# Patient Record
Sex: Female | Born: 1955 | Race: White | Hispanic: No | Marital: Married | Smoking: Former smoker
Health system: Southern US, Community
[De-identification: ages and names within clinical notes are randomized; demographics above are authoritative.]

## PROBLEM LIST (undated history)

## (undated) DIAGNOSIS — E669 Obesity, unspecified: Secondary | ICD-10-CM

## (undated) DIAGNOSIS — N200 Calculus of kidney: Secondary | ICD-10-CM

## (undated) DIAGNOSIS — R7303 Prediabetes: Secondary | ICD-10-CM

## (undated) DIAGNOSIS — F419 Anxiety disorder, unspecified: Secondary | ICD-10-CM

## (undated) DIAGNOSIS — Z9989 Dependence on other enabling machines and devices: Secondary | ICD-10-CM

## (undated) DIAGNOSIS — K219 Gastro-esophageal reflux disease without esophagitis: Secondary | ICD-10-CM

## (undated) DIAGNOSIS — G4730 Sleep apnea, unspecified: Secondary | ICD-10-CM

## (undated) DIAGNOSIS — S83206A Unspecified tear of unspecified meniscus, current injury, right knee, initial encounter: Secondary | ICD-10-CM

## (undated) DIAGNOSIS — I8392 Asymptomatic varicose veins of left lower extremity: Secondary | ICD-10-CM

## (undated) DIAGNOSIS — M1990 Unspecified osteoarthritis, unspecified site: Secondary | ICD-10-CM

## (undated) DIAGNOSIS — Z87442 Personal history of urinary calculi: Secondary | ICD-10-CM

## (undated) HISTORY — DX: Prediabetes: R73.03

## (undated) HISTORY — PX: COLONOSCOPY: SHX174

## (undated) HISTORY — DX: Dependence on other enabling machines and devices: Z99.89

## (undated) HISTORY — DX: Calculus of kidney: N20.0

## (undated) HISTORY — DX: Unspecified tear of unspecified meniscus, current injury, right knee, initial encounter: S83.206A

## (undated) HISTORY — DX: Anxiety disorder, unspecified: F41.9

## (undated) HISTORY — PX: WISDOM TOOTH EXTRACTION: SHX21

## (undated) HISTORY — PX: TONSILLECTOMY AND ADENOIDECTOMY: SUR1326

## (undated) HISTORY — DX: Gastro-esophageal reflux disease without esophagitis: K21.9

---

## 1994-07-07 HISTORY — PX: CERVICAL BIOPSY  W/ LOOP ELECTRODE EXCISION: SUR135

## 1996-07-07 HISTORY — PX: COLPOSCOPY: SHX161

## 2000-01-27 ENCOUNTER — Other Ambulatory Visit: Admission: RE | Admit: 2000-01-27 | Discharge: 2000-01-27 | Payer: Self-pay | Admitting: *Deleted

## 2000-09-02 ENCOUNTER — Other Ambulatory Visit: Admission: RE | Admit: 2000-09-02 | Discharge: 2000-09-02 | Payer: Self-pay | Admitting: Obstetrics and Gynecology

## 2001-02-15 ENCOUNTER — Other Ambulatory Visit: Admission: RE | Admit: 2001-02-15 | Discharge: 2001-02-15 | Payer: Self-pay | Admitting: Obstetrics and Gynecology

## 2001-07-07 DIAGNOSIS — S83206A Unspecified tear of unspecified meniscus, current injury, right knee, initial encounter: Secondary | ICD-10-CM

## 2001-07-07 HISTORY — DX: Unspecified tear of unspecified meniscus, current injury, right knee, initial encounter: S83.206A

## 2001-11-05 ENCOUNTER — Other Ambulatory Visit: Admission: RE | Admit: 2001-11-05 | Discharge: 2001-11-05 | Payer: Self-pay | Admitting: Obstetrics and Gynecology

## 2002-07-07 DIAGNOSIS — C801 Malignant (primary) neoplasm, unspecified: Secondary | ICD-10-CM

## 2002-07-07 HISTORY — DX: Malignant (primary) neoplasm, unspecified: C80.1

## 2003-01-20 ENCOUNTER — Other Ambulatory Visit: Admission: RE | Admit: 2003-01-20 | Discharge: 2003-01-20 | Payer: Self-pay | Admitting: Obstetrics and Gynecology

## 2004-04-16 ENCOUNTER — Other Ambulatory Visit: Admission: RE | Admit: 2004-04-16 | Discharge: 2004-04-16 | Payer: Self-pay | Admitting: Obstetrics and Gynecology

## 2004-05-13 ENCOUNTER — Ambulatory Visit (HOSPITAL_COMMUNITY): Admission: RE | Admit: 2004-05-13 | Discharge: 2004-05-13 | Payer: Self-pay | Admitting: Obstetrics and Gynecology

## 2004-07-07 HISTORY — PX: KNEE SURGERY: SHX244

## 2004-07-20 ENCOUNTER — Emergency Department: Payer: Self-pay | Admitting: Emergency Medicine

## 2005-04-23 ENCOUNTER — Other Ambulatory Visit: Admission: RE | Admit: 2005-04-23 | Discharge: 2005-04-23 | Payer: Self-pay | Admitting: Obstetrics and Gynecology

## 2005-06-02 ENCOUNTER — Ambulatory Visit (HOSPITAL_BASED_OUTPATIENT_CLINIC_OR_DEPARTMENT_OTHER): Admission: RE | Admit: 2005-06-02 | Discharge: 2005-06-02 | Payer: Self-pay | Admitting: Obstetrics and Gynecology

## 2005-06-02 ENCOUNTER — Ambulatory Visit (HOSPITAL_COMMUNITY): Admission: RE | Admit: 2005-06-02 | Discharge: 2005-06-02 | Payer: Self-pay | Admitting: Obstetrics and Gynecology

## 2005-06-02 HISTORY — PX: OTHER SURGICAL HISTORY: SHX169

## 2005-07-03 ENCOUNTER — Ambulatory Visit (HOSPITAL_COMMUNITY): Admission: RE | Admit: 2005-07-03 | Discharge: 2005-07-03 | Payer: Self-pay | Admitting: Obstetrics and Gynecology

## 2005-12-05 DIAGNOSIS — K219 Gastro-esophageal reflux disease without esophagitis: Secondary | ICD-10-CM

## 2005-12-05 HISTORY — DX: Gastro-esophageal reflux disease without esophagitis: K21.9

## 2006-06-24 ENCOUNTER — Ambulatory Visit (HOSPITAL_COMMUNITY): Admission: RE | Admit: 2006-06-24 | Discharge: 2006-06-24 | Payer: Self-pay | Admitting: Obstetrics and Gynecology

## 2006-08-25 ENCOUNTER — Other Ambulatory Visit: Admission: RE | Admit: 2006-08-25 | Discharge: 2006-08-25 | Payer: Self-pay | Admitting: Obstetrics and Gynecology

## 2007-07-02 ENCOUNTER — Ambulatory Visit (HOSPITAL_COMMUNITY): Admission: RE | Admit: 2007-07-02 | Discharge: 2007-07-02 | Payer: Self-pay | Admitting: Obstetrics and Gynecology

## 2007-08-27 ENCOUNTER — Other Ambulatory Visit: Admission: RE | Admit: 2007-08-27 | Discharge: 2007-08-27 | Payer: Self-pay | Admitting: Obstetrics and Gynecology

## 2008-07-04 ENCOUNTER — Ambulatory Visit (HOSPITAL_COMMUNITY): Admission: RE | Admit: 2008-07-04 | Discharge: 2008-07-04 | Payer: Self-pay | Admitting: Obstetrics and Gynecology

## 2008-09-01 ENCOUNTER — Other Ambulatory Visit: Admission: RE | Admit: 2008-09-01 | Discharge: 2008-09-01 | Payer: Self-pay | Admitting: Obstetrics and Gynecology

## 2009-04-04 ENCOUNTER — Emergency Department: Payer: Self-pay | Admitting: Internal Medicine

## 2009-07-11 ENCOUNTER — Ambulatory Visit (HOSPITAL_COMMUNITY): Admission: RE | Admit: 2009-07-11 | Discharge: 2009-07-11 | Payer: Self-pay | Admitting: Obstetrics and Gynecology

## 2010-07-31 ENCOUNTER — Ambulatory Visit (HOSPITAL_COMMUNITY)
Admission: RE | Admit: 2010-07-31 | Discharge: 2010-07-31 | Payer: Self-pay | Source: Home / Self Care | Attending: Obstetrics and Gynecology | Admitting: Obstetrics and Gynecology

## 2010-08-29 ENCOUNTER — Ambulatory Visit: Payer: Self-pay | Admitting: Family Medicine

## 2010-11-22 NOTE — Op Note (Signed)
NAME:  Evelyn Hayes, Evelyn Hayes            ACCOUNT NO.:  [id number]   MEDICAL RECORD NO.:  [id number]          PATIENT TYPE:  AMB   LOCATION:  [facility]                         FACILITY:  [facility]   PHYSICIAN:  Lorianna P. Romine, M.D.DATE OF BIRTH:  24-Apr-1956   DATE OF PROCEDURE:  06/02/2005  DATE OF DISCHARGE:                                 OPERATIVE REPORT   PREOPERATIVE DIAGNOSIS:  Menorrhagia.   POSTOPERATIVE DIAGNOSIS:  Menorrhagia.   PROCEDURE:  Endometrial ablation using the Hydrotherm ablator   SURGEON:  Maleny P. Romine, M.D.   ANESTHESIA:  General by LMA.   ESTIMATED BLOOD LOSS:  Minimal.   COMPLICATIONS:  None.   DESCRIPTION OF PROCEDURE:  The patient was taken to the operating room and  after induction of adequate general anesthesia was placed in the dorsal  lithotomy position and prepped and draped in the usual fashion. The bladder  was drained with a rubber catheter. A posterior weighted and anterior Sims  retractor were placed and the cervix was grasped on the anterior lip with a  single-tooth tenaculum. The uterus was sounded to 8 cm. The cervix was  dilated to a number 23 Pratt. The hysteroscope was introduced. Proper  documentation of the placement of a hysteroscope inside the endometrial  cavity was done by noting the presence of the tubal ostia. Photographic  documentation was taken. The scope was removed to just inside the internal  os. Endometrial ablation was then carried out in the usual fashion according  to the manufacturer's instructions. Following ablation, photographic  documentation again was taken, the scope was withdrawn, the instruments were  removed and the procedure was terminated. The patient tolerated it well and  went in satisfactory condition to post anesthesia recovery.           ______________________________  Edwena Felty. Romine, M.D.     CPR/MEDQ  D:  06/02/2005  T:  06/02/2005  Job:  [id number]

## 2011-03-17 ENCOUNTER — Ambulatory Visit: Payer: Self-pay | Admitting: Family Medicine

## 2011-04-01 ENCOUNTER — Ambulatory Visit: Payer: Self-pay | Admitting: Family Medicine

## 2011-05-08 DIAGNOSIS — Z9989 Dependence on other enabling machines and devices: Secondary | ICD-10-CM

## 2011-05-08 HISTORY — DX: Dependence on other enabling machines and devices: Z99.89

## 2011-07-11 ENCOUNTER — Other Ambulatory Visit: Payer: Self-pay | Admitting: Obstetrics and Gynecology

## 2011-07-11 DIAGNOSIS — Z1231 Encounter for screening mammogram for malignant neoplasm of breast: Secondary | ICD-10-CM

## 2011-08-07 ENCOUNTER — Ambulatory Visit (HOSPITAL_COMMUNITY)
Admission: RE | Admit: 2011-08-07 | Discharge: 2011-08-07 | Disposition: A | Discharge status: Home / Self Care | Payer: BC Managed Care – PPO | Source: Ambulatory Visit | Attending: Obstetrics and Gynecology | Admitting: Obstetrics and Gynecology

## 2011-08-07 DIAGNOSIS — Z1231 Encounter for screening mammogram for malignant neoplasm of breast: Secondary | ICD-10-CM | POA: Insufficient documentation

## 2012-07-05 ENCOUNTER — Other Ambulatory Visit: Payer: Self-pay | Admitting: Obstetrics and Gynecology

## 2012-07-05 DIAGNOSIS — Z1231 Encounter for screening mammogram for malignant neoplasm of breast: Secondary | ICD-10-CM

## 2012-08-03 ENCOUNTER — Ambulatory Visit: Payer: Self-pay | Admitting: Orthopedic Surgery

## 2012-08-17 ENCOUNTER — Ambulatory Visit (HOSPITAL_COMMUNITY): Payer: BC Managed Care – PPO

## 2012-08-20 ENCOUNTER — Ambulatory Visit (HOSPITAL_COMMUNITY): Payer: BC Managed Care – PPO

## 2012-08-31 ENCOUNTER — Ambulatory Visit (HOSPITAL_COMMUNITY)
Admission: RE | Admit: 2012-08-31 | Discharge: 2012-08-31 | Disposition: A | Discharge status: Home / Self Care | Payer: BC Managed Care – PPO | Source: Ambulatory Visit | Attending: Obstetrics and Gynecology | Admitting: Obstetrics and Gynecology

## 2012-08-31 DIAGNOSIS — Z1231 Encounter for screening mammogram for malignant neoplasm of breast: Secondary | ICD-10-CM | POA: Insufficient documentation

## 2013-01-17 ENCOUNTER — Ambulatory Visit: Payer: Self-pay | Admitting: Obstetrics and Gynecology

## 2013-01-27 ENCOUNTER — Encounter: Payer: Self-pay | Admitting: Obstetrics and Gynecology

## 2013-01-28 ENCOUNTER — Encounter: Payer: Self-pay | Admitting: Obstetrics and Gynecology

## 2013-01-28 ENCOUNTER — Ambulatory Visit (INDEPENDENT_AMBULATORY_CARE_PROVIDER_SITE_OTHER): Payer: BC Managed Care – PPO | Admitting: Obstetrics and Gynecology

## 2013-01-28 VITALS — BP 122/68 | HR 76 | Resp 18 | Ht 62.75 in | Wt 214.0 lb

## 2013-01-28 DIAGNOSIS — Z0000 Encounter for general adult medical examination without abnormal findings: Secondary | ICD-10-CM

## 2013-01-28 DIAGNOSIS — Z01419 Encounter for gynecological examination (general) (routine) without abnormal findings: Secondary | ICD-10-CM

## 2013-01-28 NOTE — Patient Instructions (Addendum)

## 2013-01-28 NOTE — Progress Notes (Signed)
57 y.o.   Married    Caucasian   female   G3P3   here for annual exam.    Patient's last menstrual period was 04/06/2008.          Sexually active: yes  The current method of family planning is vasectomy.    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 Osteoporosis Maternal Grandmother   . Hypertension Mother   . Heart disease Father     There are no active problems to display for this patient.   Past Medical History  Diagnosis Date  . 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 Knee cartilage, torn, right 2003  . GERD (gastroesophageal reflux disease)   . 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 Hypo thermal ablation  06/02/05  . 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 Tonsillectomy and adenoidectomy    . Knee surgery  07/2004  . Colposcopy  1998  . Wisdom tooth extraction      Allergies: Review of patient's allergies indicates no known allergies.  Current Outpatient Prescriptions  Medication Sig Dispense Refill  . aspirin 81 MG tablet Take 81 mg by mouth daily.      . Calcium Carbonate (CALCIUM 600 PO) Take by mouth 2 (two) times daily.      . Fish Oil-Cholecalciferol (FISH OIL + D3 PO) Take by mouth daily.      . fluticasone (FLONASE) 50 MCG/ACT nasal spray       . Glucosamine HCl (GLUCOSAMINE PO) Take by mouth daily.      . Naproxen Sodium (ALEVE PO) Take by mouth as needed.      Marland Kitchen OMEPRAZOLE PO Take 40 mg by mouth daily.        No current facility-administered  medications for this visit.    ROS: Pertinent items are noted in HPI.  Social Hx:  Married, three children, works as Interior and spatial designer of [employer]. Travels two weeks out of every month.  Exam:    BP 122/68  Pulse 76  Resp 18  Ht 5' 2.75" (1.594 m)  Wt 214 lb (97.07 kg)  BMI 38.2 kg/m2  LMP 04/06/2008  Ht stable and wt down 14 pounds from last year Wt Readings from Last 3 Encounters:  01/28/13 214 lb (97.07 kg)     Ht Readings from Last 3 Encounters:  01/28/13 5' 2.75" (1.594 m)    General appearance: alert, cooperative and appears stated age Head: Normocephalic, without obvious abnormality, atraumatic Neck: no adenopathy, supple, symmetrical, trachea midline and thyroid not enlarged, symmetric, no tenderness/mass/nodules Lungs: clear to auscultation bilaterally Breasts: Inspection negative, No nipple retraction or dimpling, No nipple discharge or bleeding, No axillary or supraclavicular adenopathy, Normal to palpation without dominant masses Heart: regular rate and rhythm Abdomen: soft, non-tender; bowel sounds normal; no masses,  no organomegaly Extremities: extremities normal, atraumatic, no cyanosis or edema Skin: Skin color, texture, turgor normal. No rashes or lesions Lymph  nodes: Cervical, supraclavicular, and axillary nodes normal. No abnormal inguinal nodes palpated Neurologic: Grossly normal   Pelvic: External genitalia:  no lesions              Urethra:  normal appearing urethra with no masses, tenderness or lesions              Bartholins and Skenes: normal                 Vagina: normal appearing vagina with normal color and discharge, no lesions, cx just behind the introitus and there is a Grade 2-3 cystocele              Cervix: normal appearance              Pap taken: yes        Bimanual Exam:  Uterus:  uterus is normal size, shape, consistency and nontender                                      Adnexa: normal adnexa in size, nontender and no masses                                       Rectovaginal: Confirms                                      Anus:  normal sphincter tone, no lesions  A: normal menopausal exam, no HRT     HTA 2006 with subsequent amenorrhea     LEEP showing CIN 3 in 1996     GERD     Gr 2-3 cystocele with uterine prolapse     P:     mammogram pap smear counseled on breast self exam, mammography screening, adequate intake of calcium and vitamin D, diet and exercise return annually or prn   Discussed option for surgery for prolapse vs pessary.  Pt wants to try the pessary.     An After Visit Summary was printed and given to the patient.

## 2013-02-01 LAB — IPS PAP TEST WITH HPV

## 2013-08-17 ENCOUNTER — Other Ambulatory Visit: Payer: Self-pay | Admitting: Nurse Practitioner

## 2013-08-17 DIAGNOSIS — Z1231 Encounter for screening mammogram for malignant neoplasm of breast: Secondary | ICD-10-CM

## 2013-09-06 ENCOUNTER — Ambulatory Visit (HOSPITAL_COMMUNITY)
Admission: RE | Admit: 2013-09-06 | Discharge: 2013-09-06 | Disposition: A | Discharge status: Home / Self Care | Payer: BC Managed Care – PPO | Source: Ambulatory Visit | Attending: Nurse Practitioner | Admitting: Nurse Practitioner

## 2013-09-06 DIAGNOSIS — Z1231 Encounter for screening mammogram for malignant neoplasm of breast: Secondary | ICD-10-CM | POA: Insufficient documentation

## 2014-05-08 ENCOUNTER — Encounter: Payer: Self-pay | Admitting: Obstetrics and Gynecology

## 2014-08-15 ENCOUNTER — Other Ambulatory Visit (HOSPITAL_COMMUNITY): Payer: Self-pay | Admitting: Family Medicine

## 2014-08-15 DIAGNOSIS — Z1231 Encounter for screening mammogram for malignant neoplasm of breast: Secondary | ICD-10-CM

## 2014-09-12 ENCOUNTER — Ambulatory Visit (HOSPITAL_COMMUNITY)
Admission: RE | Admit: 2014-09-12 | Discharge: 2014-09-12 | Disposition: A | Discharge status: Home / Self Care | Payer: BLUE CROSS/BLUE SHIELD | Source: Ambulatory Visit | Attending: Family Medicine | Admitting: Family Medicine

## 2014-09-12 DIAGNOSIS — Z1231 Encounter for screening mammogram for malignant neoplasm of breast: Secondary | ICD-10-CM | POA: Diagnosis present

## 2015-04-03 ENCOUNTER — Telehealth: Payer: Self-pay | Admitting: Family Medicine

## 2015-04-03 NOTE — Telephone Encounter (Signed)
Pt is requesting a call back with her last lab results.  MM#[phone]/PC

## 2015-04-03 NOTE — Telephone Encounter (Signed)
Please review. Thanks!  

## 2015-04-03 NOTE — Telephone Encounter (Signed)
I do not see any labs.???

## 2015-04-03 NOTE — Telephone Encounter (Signed)
i believe they were attached on the green chart and placed on the cart earlier this week-aa

## 2015-04-05 NOTE — Telephone Encounter (Signed)
I will look but I still have not seen that.

## 2015-04-05 NOTE — Telephone Encounter (Signed)
LMTCB-need to ask patient if these labs were done through another office? Or [place]? -aa

## 2015-04-09 NOTE — Telephone Encounter (Signed)
Spoke with patient, she need wellness form filled out from when she was seen in [place]

## 2015-07-10 ENCOUNTER — Ambulatory Visit (INDEPENDENT_AMBULATORY_CARE_PROVIDER_SITE_OTHER): Payer: BLUE CROSS/BLUE SHIELD | Admitting: Family Medicine

## 2015-07-10 ENCOUNTER — Encounter: Payer: Self-pay | Admitting: Family Medicine

## 2015-07-10 VITALS — BP 112/62 | HR 74 | Temp 97.8°F | Resp 18 | Ht 63.0 in | Wt 237.0 lb

## 2015-07-10 DIAGNOSIS — R7303 Prediabetes: Secondary | ICD-10-CM | POA: Insufficient documentation

## 2015-07-10 DIAGNOSIS — K219 Gastro-esophageal reflux disease without esophagitis: Secondary | ICD-10-CM | POA: Diagnosis not present

## 2015-07-10 DIAGNOSIS — J309 Allergic rhinitis, unspecified: Secondary | ICD-10-CM | POA: Diagnosis not present

## 2015-07-10 DIAGNOSIS — F32A Depression, unspecified: Secondary | ICD-10-CM

## 2015-07-10 DIAGNOSIS — G4730 Sleep apnea, unspecified: Secondary | ICD-10-CM | POA: Insufficient documentation

## 2015-07-10 DIAGNOSIS — F419 Anxiety disorder, unspecified: Secondary | ICD-10-CM | POA: Insufficient documentation

## 2015-07-10 DIAGNOSIS — E669 Obesity, unspecified: Secondary | ICD-10-CM | POA: Insufficient documentation

## 2015-07-10 DIAGNOSIS — E119 Type 2 diabetes mellitus without complications: Secondary | ICD-10-CM | POA: Insufficient documentation

## 2015-07-10 DIAGNOSIS — E7800 Pure hypercholesterolemia, unspecified: Secondary | ICD-10-CM | POA: Insufficient documentation

## 2015-07-10 DIAGNOSIS — C44101 Unspecified malignant neoplasm of skin of unspecified eyelid, including canthus: Secondary | ICD-10-CM | POA: Insufficient documentation

## 2015-07-10 DIAGNOSIS — Z0000 Encounter for general adult medical examination without abnormal findings: Secondary | ICD-10-CM

## 2015-07-10 DIAGNOSIS — R748 Abnormal levels of other serum enzymes: Secondary | ICD-10-CM | POA: Insufficient documentation

## 2015-07-10 DIAGNOSIS — F329 Major depressive disorder, single episode, unspecified: Secondary | ICD-10-CM | POA: Diagnosis not present

## 2015-07-10 DIAGNOSIS — E559 Vitamin D deficiency, unspecified: Secondary | ICD-10-CM | POA: Insufficient documentation

## 2015-07-10 DIAGNOSIS — F4320 Adjustment disorder, unspecified: Secondary | ICD-10-CM | POA: Insufficient documentation

## 2015-07-10 LAB — POCT URINALYSIS DIPSTICK
Bilirubin, UA: NEGATIVE
Blood, UA: NEGATIVE
Glucose, UA: NEGATIVE
Ketones, UA: NEGATIVE
Leukocytes, UA: NEGATIVE
Nitrite, UA: NEGATIVE
Protein, UA: NEGATIVE
Spec Grav, UA: 1.01
Urobilinogen, UA: 0.2
pH, UA: 7

## 2015-07-10 MED ORDER — SERTRALINE HCL 50 MG PO TABS: 50.0000 mg | ORAL_TABLET | Freq: Every day | ORAL | Status: DC

## 2015-07-10 MED ORDER — FLUTICASONE PROPIONATE 50 MCG/ACT NA SUSP: 2.0000 | Freq: Every day | NASAL | Status: DC

## 2015-07-10 MED ORDER — OMEPRAZOLE 40 MG PO CPDR: 40.0000 mg | DELAYED_RELEASE_CAPSULE | Freq: Every day | ORAL | Status: DC

## 2015-07-10 NOTE — Progress Notes (Signed)
Patient ID: Evelyn Hayes, female   DOB: [birth year], 58 y.o.   MRN: [id number]       [name], Female    DOB: 09-09-1955, 60 y.o.   MRN: [id number] Visit Date: 07/10/2015  Today's Provider: Wilhemena Durie, MD   Chief Complaint  Patient presents with  . Annual Exam   Subjective:    Annual physical exam Evelyn Hayes is a 61 y.o. female who presents today for health maintenance and complete physical. She feels well. She reports exercising twice a week water aerobics. She reports she is sleeping well. Patient is married and worse very busy job. She is a mother of 3 daughters and has 3 grandchildren. Her oldest 2 daughters are happily married. Her third daughter is 28 and lives at home with bipolar disorder, ADD, and an eating disorder. This is causing a lot of stress for the patient. ----------------------------------------------------------------- Pap- 07/03/14 WNL Neg HPV Mammo- 09/12/14 WNL Colonoscopy- 11/14/08- Dr. Benson Norway Diverticulosis, hemorrhoids repeat 10 years Tdap- 06/19/05 EKG- 02/05/11   Pt had labs done by Dr. Nehemiah Massed on 04/10/15, they are in the care everywhere section.  Review of Systems  Constitutional: Negative.   HENT: Negative.   Eyes: Positive for visual disturbance (floaters, treated 06/2015).  Respiratory: Negative.   Cardiovascular: Negative.   Gastrointestinal: Negative.   Endocrine: Negative.   Genitourinary: Negative.   Musculoskeletal: Positive for arthralgias (right hip, right knee and right heel).  Allergic/Immunologic: Negative.   Neurological: Negative.   Hematological: Negative.   Psychiatric/Behavioral: Negative.     Social History      She  reports that she has quit smoking. She has never used smokeless tobacco. She reports that she drinks about 2.0 oz of alcohol per week. She reports that she does not use illicit drugs.       Social History   Social History  . Marital Status: Married    Spouse Name: N/A  .  Number of Children: N/A  . Years of Education: N/A   Social History Main Topics  . Smoking status: Former Research scientist (life sciences)  . Smokeless tobacco: Never Used     Comment: smoked for about 2 years in high school  . Alcohol Use: 2.0 oz/week    4 Standard drinks or equivalent per week     Comment: 2-4 drinks a week (beer or wine)  . Drug Use: No  . Sexual Activity:    Partners: Male    Birth Control/ Protection: Post-menopausal   Other Topics Concern  . None   Social History Narrative    Past Medical History  Diagnosis Date  . 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 Knee cartilage, torn, right 2003  . GERD (gastroesophageal reflux disease)   . 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 Adult-onset obesity 07/10/2015  . Allergic rhinitis 07/10/2015  . Anxiety 07/10/2015  . 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 Diabetes ([place]) 07/10/2015  . Abnormal liver enzymes 07/10/2015  . Acid reflux 07/10/2015  . Hypercholesteremia 07/10/2015  . Borderline diabetes 07/10/2015  . Apnea, sleep 07/10/2015  . Avitaminosis D 07/10/2015    Past Surgical History  Procedure Laterality Date  . Hypo thermal ablation  06/02/05  . 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 Tonsillectomy and adenoidectomy    . Knee surgery  07/2004  .  Colposcopy  1998  . Wisdom tooth extraction      Family History        Family Status  Relation Status Death Age  . Mother Alive   . Father Deceased         Her family history includes Heart disease in her father; Hypertension in her mother; Osteoporosis in her maternal grandmother.    No Known Allergies  Previous Medications   ASPIRIN 81 MG TABLET    Take 81 mg by mouth daily. Reported on 07/10/2015   OMEPRAZOLE PO    Take 40 mg by mouth daily.     Patient Care Team: Jerrol Banana., MD as PCP -  General (Family Medicine)     Objective:   Vitals: BP 112/62 mmHg  Pulse 74  Temp(Src) 97.8 F (36.6 C) (Oral)  Resp 18  Ht 5\' 3"  (1.6 m)  Wt 237 lb (107.502 kg)  BMI 41.99 kg/m2  LMP 04/06/2008   Physical Exam  Constitutional: She is oriented to person, place, and time. She appears well-developed and well-nourished.  HENT:  Head: Normocephalic and atraumatic.  Right Ear: External ear normal.  Left Ear: External ear normal.  Nose: Nose normal.  Mouth/Throat: Oropharynx is clear and moist.  Eyes: Conjunctivae and EOM are normal. Pupils are equal, round, and reactive to light.  Neck: Normal range of motion. No thyromegaly present.  Cardiovascular: Normal rate, regular rhythm, normal heart sounds and intact distal pulses.   Pulmonary/Chest: Effort normal and breath sounds normal.  Abdominal: Soft. Bowel sounds are normal.  Musculoskeletal: Normal range of motion.  Lymphadenopathy:    She has no cervical adenopathy.  Neurological: She is alert and oriented to person, place, and time. She has normal reflexes.  Skin: Skin is warm and dry.  Psychiatric: She has a normal mood and affect. Her behavior is normal. Judgment and thought content normal.     Depression Screen PHQ 2/9 Scores 07/10/2015  PHQ - 2 Score 2  PHQ- 9 Score 10      Assessment & Plan:     Routine Health Maintenance and Physical Exam  Exercise Activities and Dietary recommendations Goals    None       There is no immunization history on file for this patient.  Health Maintenance  Topic Date Due  . HEMOGLOBIN A1C  1956/01/21  . Hepatitis C Screening  08-Aug-1955  . PNEUMOCOCCAL POLYSACCHARIDE VACCINE (1) 04/13/1960  . FOOT EXAM  04/13/1968  . OPHTHALMOLOGY EXAM  04/13/1968  . HIV Screening  04/13/1973  . TETANUS/TDAP  04/13/1977  . COLONOSCOPY  04/13/2008  . INFLUENZA VACCINE  07/09/2016 (Originally 02/05/2015)  . PAP SMEAR  01/29/2016  . MAMMOGRAM  09/11/2016   1. Annual physical exam -  Ambulatory referral to Gynecology- for annual GYN care due to history of abnormal Pap smears. Patient is worried about the risk of ovarian cancer. She has no symptoms. - POCT urinalysis dipstick  2. Depression PHQ-9 today-10 Follow up in 1 month. Repeat PHQ-9 - sertraline (ZOLOFT) 50 MG tablet; Take 1 tablet (50 mg total) by mouth daily.  Dispense: 30 tablet; Refill: 1 Discussed sexual side effects. May need referral for counseling regarding her situation with her youngest daughter 35. Allergic rhinitis, unspecified allergic rhinitis type Refills provided - fluticasone (FLONASE) 50 MCG/ACT nasal spray; Place 2 sprays into both nostrils daily.  Dispense: 16 g; Refill: 3  4. Gastroesophageal reflux disease, esophagitis presence not specified Refilled provided - omeprazole (PRILOSEC) 40 MG capsule;  Take 1 capsule (40 mg total) by mouth daily.  Dispense: 90 capsule; Refill: 3 5. Obesity Diet and exercise discussed at some length.  I have done the exam and reviewed the above chart and it is accurate to the best of my knowledge. Patient was seen and examined by Dr. Miguel Aschoff, and noted scribed by Webb Laws, CMA   Discussed health benefits of physical activity, and encouraged her to engage in regular exercise appropriate for her age and condition.    --------------------------------------------------------------------

## 2015-08-08 ENCOUNTER — Other Ambulatory Visit: Payer: Self-pay | Admitting: Family Medicine

## 2015-08-08 DIAGNOSIS — Z1231 Encounter for screening mammogram for malignant neoplasm of breast: Secondary | ICD-10-CM

## 2015-08-22 ENCOUNTER — Ambulatory Visit (INDEPENDENT_AMBULATORY_CARE_PROVIDER_SITE_OTHER): Payer: BLUE CROSS/BLUE SHIELD | Admitting: Obstetrics and Gynecology

## 2015-08-22 ENCOUNTER — Encounter: Payer: Self-pay | Admitting: Obstetrics and Gynecology

## 2015-08-22 VITALS — BP 130/80 | HR 88 | Resp 15 | Ht 63.0 in | Wt 232.0 lb

## 2015-08-22 DIAGNOSIS — N819 Female genital prolapse, unspecified: Secondary | ICD-10-CM | POA: Diagnosis not present

## 2015-08-22 DIAGNOSIS — Z124 Encounter for screening for malignant neoplasm of cervix: Secondary | ICD-10-CM

## 2015-08-22 DIAGNOSIS — Z01419 Encounter for gynecological examination (general) (routine) without abnormal findings: Secondary | ICD-10-CM

## 2015-08-22 NOTE — Patient Instructions (Signed)
EXERCISE AND DIET:  We recommended that you start or continue a regular exercise program for good health. Regular exercise means any activity that makes your heart beat faster and makes you sweat.  We recommend exercising at least 30 minutes per day at least 3 days a week, preferably 4 or 5.  We also recommend a diet low in fat and sugar.  Inactivity, poor dietary choices and obesity can cause diabetes, heart attack, stroke, and kidney damage, among others.    ALCOHOL AND SMOKING:  Women should limit their alcohol intake to no more than 7 drinks/beers/glasses of wine (combined, not each!) per week. Moderation of alcohol intake to this level decreases your risk of breast cancer and liver damage. And of course, no recreational drugs are part of a healthy lifestyle.  And absolutely no smoking or even second hand smoke. Most people know smoking can cause heart and lung diseases, but did you know it also contributes to weakening of your bones? Aging of your skin?  Yellowing of your teeth and nails?  CALCIUM AND VITAMIN D:  Adequate intake of calcium and Vitamin D are recommended.  The recommendations for exact amounts of these supplements seem to change often, but generally speaking 600 mg of calcium (either carbonate or citrate) and 800 units of Vitamin D per day seems prudent. Certain women may benefit from higher intake of Vitamin D.  If you are among these women, your doctor will have told you during your visit.    PAP SMEARS:  Pap smears, to check for cervical cancer or precancers,  have traditionally been done yearly, although recent scientific advances have shown that most women can have pap smears less often.  However, every woman still should have a physical exam from her gynecologist every year. It will include a breast check, inspection of the vulva and vagina to check for abnormal growths or skin changes, a visual exam of the cervix, and then an exam to evaluate the size and shape of the uterus and  ovaries.  And after 58 years of age, a rectal exam is indicated to check for rectal cancers. We will also provide age appropriate advice regarding health maintenance, like when you should have certain vaccines, screening for sexually transmitted diseases, bone density testing, colonoscopy, mammograms, etc.   MAMMOGRAMS:  All women over 40 years old should have a yearly mammogram. Many facilities now offer a "3D" mammogram, which may cost around $50 extra out of pocket. If possible,  we recommend you accept the option to have the 3D mammogram performed.  It both reduces the number of women who will be called back for extra views which then turn out to be normal, and it is better than the routine mammogram at detecting truly abnormal areas.    COLONOSCOPY:  Colonoscopy to screen for colon cancer is recommended for all women at age 50.  We know, you hate the idea of the prep.  We agree, BUT, having colon cancer and not knowing it is worse!!  Colon cancer so often starts as a polyp that can be seen and removed at colonscopy, which can quite literally save your life!  And if your first colonoscopy is normal and you have no family history of colon cancer, most women don't have to have it again for 10 years.  Once every ten years, you can do something that may end up saving your life, right?  We will be happy to help you get it scheduled when you are ready.    Be sure to check your insurance coverage so you understand how much it will cost.  It may be covered as a preventative service at no cost, but you should check your particular policy.     About Cystocele  Overview  The pelvic organs, including the bladder, are normally supported by pelvic floor muscles and ligaments.  When these muscles and ligaments are stretched, weakened or torn, the wall between the bladder and the vagina sags or herniates causing a prolapse, sometimes called a cystocele.  This condition may cause discomfort and problems with emptying  the bladder.  It can be present in various stages.  Some people are not aware of the changes.  Others may notice changes at the vaginal opening or a feeling of the bladder dropping outside the body.  Causes of a Cystocele  A cystocele is usually caused by muscle straining or stretching during childbirth.  In addition, cystocele is more common after menopause, because the hormone estrogen helps keep the elastic tissues around the pelvic organs strong.  A cystocele is more likely to occur when levels of estrogen decrease.  Other causes include: heavy lifting, chronic coughing, previous pelvic surgery and obesity.  Symptoms  A bladder that has dropped from its normal position may cause: unwanted urine leakage (stress incontinence), frequent urination or urge to urinate, incomplete emptying of the bladder (not feeling bladder relief after emptying), pain or discomfort in the vagina, pelvis, groin, lower back or lower abdomen and frequent urinary tract infections.  Mild cases may not cause any symptoms.  Treatment Options  Pelvic floor (Kegel) exercises:  Strength training the muscles in your genital area  Behavioral changes: Treating and preventing constipation, taking time to empty your bladder properly, learning to lift properly and/or avoid heavy lifting when possible, stopping smoking, avoiding weight gain and treating a chronic cough or bronchitis.  A pessary: A vaginal support device is sometimes used to help pelvic support caused by muscle and ligament changes.  Surgery: Surgical repair may be necessary if symptoms cannot be managed with exercise, behavioral changes and a pessary.  Surgery is usually considered for severe cases.   2007, [employer] 

## 2015-08-22 NOTE — Progress Notes (Signed)
Patient ID: Evelyn Hayes, female   DOB: [birth year], 60 y.o.   MRN: [id number] 60 y.o. G3P3 MarriedCaucasianF here for annual exam.  No vaginal bleeding. No dyspareunia. Occasional, tolerable night sweats. No vaginal dryness. She is worried about ovarian cancer, used talc growing up.  She has a h/o genital prolapse. Most of the time it's tolerable. Rare that she notices it. No difficulty emptying her bladder. She c/o mild GSI if she has a cold. She knows how to do Kegels.    Patient's last menstrual period was 04/06/2008.          Sexually active: Yes.    The current method of family planning is post menopausal status.    Exercising: Yes.    water aeorbics  Smoker:  former  Health Maintenance: Pap:  01-28-13 WNL NEG HR HPV She reports a normal pap in 12/15 with her primary History of abnormal Pap:  Yes- 1998 - LEEP MMG:  09-13-14 WNL Colonoscopy:  2010 WNL BMD:   Never TDaP:  Up to date per patient  Gardasil: N/A   reports that she has quit smoking. She has never used smokeless tobacco. She reports that she drinks about 2.0 oz of alcohol per week. She reports that she does not use illicit drugs.She is in HR, travels a lot for work. Kids are 25 to 47, 3 grandchildren. All kids are local. Grand kids are 2-5.   Past Medical History  Diagnosis Date  . 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 Knee cartilage, torn, right 2003  . GERD (gastroesophageal reflux disease)   . 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 Hypo thermal ablation  06/02/05  . 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 Tonsillectomy and adenoidectomy    . Knee surgery  07/2004  . Colposcopy  1998  . Wisdom tooth extraction      Current Outpatient Prescriptions  Medication Sig Dispense Refill  . fluticasone (FLONASE) 50 MCG/ACT nasal spray Place 2 sprays into both nostrils daily. 16 g 3  . omeprazole (PRILOSEC) 40 MG  capsule Take 1 capsule (40 mg total) by mouth daily. 90 capsule 3  . sertraline (ZOLOFT) 50 MG tablet Take 1 tablet (50 mg total) by mouth daily. 30 tablet 1   No current facility-administered medications for this visit.    Family History  Problem Relation Age of Onset  . Osteoporosis Maternal Grandmother   . Hypertension Mother   . Heart disease Father   . Ovarian cancer Cousin 4  . Breast cancer Other     Review of Systems  Constitutional: Negative.   HENT: Negative.   Eyes: Negative.   Respiratory: Negative.   Cardiovascular: Negative.   Gastrointestinal: Negative.   Endocrine: Negative.   Genitourinary: Negative.   Musculoskeletal: Negative.   Skin: Negative.   Allergic/Immunologic: Negative.   Neurological: Negative.   Psychiatric/Behavioral: Negative.     Exam:   BP 130/80 mmHg  Pulse 88  Resp 15  Ht 5\' 3"  (1.6 m)  Wt 232 lb (105.235 kg)  BMI 41.11 kg/m2  LMP 04/06/2008  Weight change: @WEIGHTCHANGE @ Height:   Height: 5\' 3"  (160 cm)  Ht Readings from Last 3 Encounters:  08/22/15 5\' 3"  (1.6 m)  07/10/15 5\' 3"  (1.6 m)  01/28/13 5' 2.75" (1.594 m)    General appearance: alert, cooperative and appears stated age Head: Normocephalic, without obvious abnormality, atraumatic Neck:  no adenopathy, supple, symmetrical, trachea midline and thyroid normal to inspection and palpation Lungs: clear to auscultation bilaterally Breasts: normal appearance, no masses or tenderness Heart: regular rate and rhythm Abdomen: soft, non-tender; bowel sounds normal; no masses,  no organomegaly Extremities: extremities normal, atraumatic, no cyanosis or edema Skin: Skin color, texture, turgor normal. No rashes or lesions Lymph nodes: Cervical, supraclavicular, and axillary nodes normal. No abnormal inguinal nodes palpated Neurologic: Grossly normal   Pelvic: External genitalia:  no lesions              Urethra:  normal appearing urethra with no masses, tenderness or lesions               Bartholins and Skenes: normal                 Vagina: normal appearing vagina with normal color and discharge, no lesions. Mild atrophy. She has a large grade 2 cystocele, grade 1-2 uterine prolapse and a grade one rectocele.  Examined supine with and without valsalva.               Cervix: no lesions               Bimanual Exam:  Uterus:  normal size, contour, position, consistency, mobility, non-tender              Adnexa: no mass, fullness, tenderness               Rectovaginal: Confirms               Anus:  normal sphincter tone, no lesions  Chaperone was present for exam.  A:  Well Woman with normal exam  Genital prolapse, not bothersome. Avoid heavy lifting and straining.   Weight gain, discussed exercise and eating healthy  P:   Labs and immunizations with primary  She desires pap  Mammogram next month  Colonoscopy at 59, due at 62  Discussed calcium and vit d, she will get her vit D level checked with her primary

## 2015-08-23 NOTE — Addendum Note (Signed)
Addended by: Dorothy Spark on: 08/23/2015 11:25 AM   Modules accepted: Orders

## 2015-08-27 LAB — IPS PAP TEST WITH HPV

## 2015-09-06 ENCOUNTER — Ambulatory Visit: Payer: BLUE CROSS/BLUE SHIELD | Admitting: Family Medicine

## 2015-09-11 ENCOUNTER — Ambulatory Visit: Payer: BLUE CROSS/BLUE SHIELD | Admitting: Family Medicine

## 2015-09-17 ENCOUNTER — Encounter: Payer: Self-pay | Admitting: Family Medicine

## 2015-09-17 ENCOUNTER — Ambulatory Visit (INDEPENDENT_AMBULATORY_CARE_PROVIDER_SITE_OTHER): Payer: BLUE CROSS/BLUE SHIELD | Admitting: Family Medicine

## 2015-09-17 VITALS — BP 116/68 | HR 78 | Temp 97.7°F | Resp 16 | Wt 232.0 lb

## 2015-09-17 DIAGNOSIS — J309 Allergic rhinitis, unspecified: Secondary | ICD-10-CM | POA: Diagnosis not present

## 2015-09-17 DIAGNOSIS — F329 Major depressive disorder, single episode, unspecified: Secondary | ICD-10-CM | POA: Diagnosis not present

## 2015-09-17 DIAGNOSIS — F419 Anxiety disorder, unspecified: Secondary | ICD-10-CM | POA: Diagnosis not present

## 2015-09-17 DIAGNOSIS — F32A Depression, unspecified: Secondary | ICD-10-CM

## 2015-09-17 MED ORDER — FLUTICASONE PROPIONATE 50 MCG/ACT NA SUSP: 2.0000 | Freq: Every day | NASAL | Status: AC

## 2015-09-17 MED ORDER — SERTRALINE HCL 50 MG PO TABS: 50.0000 mg | ORAL_TABLET | Freq: Every day | ORAL | Status: DC

## 2015-09-17 NOTE — Progress Notes (Signed)
Patient ID: Evelyn Hayes, female   DOB: [birth year], 60 y.o.   MRN: [id number]    Subjective:  HPI Depression- Pt is here for a 1 month follow up on depression. She was started on Sertraline and reports that she is doing well on it no side effects that she can tell and she is feeling better. PHQ-9 on last OV was 10.  Today Pt scored a 2 on the PHQ-9.  Prior to Admission medications   Medication Sig Start Date End Date Taking? Authorizing Provider  fluticasone (FLONASE) 50 MCG/ACT nasal spray Place 2 sprays into both nostrils daily. 07/10/15  Yes Richard Maceo Pro., MD  omeprazole (PRILOSEC) 40 MG capsule Take 1 capsule (40 mg total) by mouth daily. 07/10/15  Yes Richard Maceo Pro., MD  sertraline (ZOLOFT) 50 MG tablet Take 1 tablet (50 mg total) by mouth daily. 07/10/15  Yes Richard Maceo Pro., MD    Patient Active Problem List   Diagnosis Date Noted  . 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 Adult-onset obesity 07/10/2015  . Allergic rhinitis 07/10/2015  . Anxiety 07/10/2015  . 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 Diabetes ([place]) 07/10/2015  . Abnormal liver enzymes 07/10/2015  . Acid reflux 07/10/2015  . Hypercholesteremia 07/10/2015  . Borderline diabetes 07/10/2015  . Apnea, sleep 07/10/2015  . Avitaminosis D 07/10/2015    Past Medical History  Diagnosis Date  . 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 Knee cartilage, torn, right 2003  . GERD (gastroesophageal reflux disease)   . 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 Anxiety     Social History   Social History  . Marital Status: Married    Spouse Name: N/A  . Number of Children: N/A  . Years of Education: N/A   Occupational History  . Not on file.   Social History Main Topics  . Smoking status: Former Research scientist (life sciences)  . Smokeless tobacco: Never Used     Comment: smoked for about 2 years in high school  . Alcohol Use: 2.0 oz/week    4 Standard drinks or equivalent  per week     Comment: 2-4 drinks a week (beer or wine)  . Drug Use: No  . Sexual Activity:    Partners: Male    Birth Control/ Protection: Post-menopausal   Other Topics Concern  . Not on file   Social History Narrative    No Known Allergies  Review of Systems  Constitutional: Negative.   HENT: Negative.   Eyes: Negative.   Respiratory: Negative.   Cardiovascular: Negative.   Gastrointestinal: Negative.   Genitourinary: Negative.   Musculoskeletal: Negative.   Skin: Negative.   Neurological: Negative.   Endo/Heme/Allergies: Negative.   Psychiatric/Behavioral: Negative.      There is no immunization history on file for this patient. Objective:  BP 116/68 mmHg  Pulse 78  Temp(Src) 97.7 F (36.5 C) (Oral)  Resp 16  Wt 232 lb (105.235 kg)  LMP 04/06/2008  Physical Exam  Constitutional: She is oriented to person, place, and time and well-developed, well-nourished, and in no distress.  HENT:  Head: Normocephalic and atraumatic.  Right Ear: External ear normal.  Nose: Nose normal.  Eyes: Conjunctivae and EOM are normal. Pupils are equal, round, and reactive to light.  Neck: Normal range of motion. Neck supple.  Cardiovascular: Normal rate, regular rhythm, normal heart sounds and intact distal pulses.   Pulmonary/Chest: Effort normal and breath sounds normal.  Musculoskeletal: Normal range of  motion.  Neurological: She is alert and oriented to person, place, and time. She has normal reflexes. Gait normal. GCS score is 15.  Skin: Skin is warm and dry.  Psychiatric: Mood, memory, affect and judgment normal.    No results found for: WBC, HGB, HCT, PLT, GLUCOSE, CHOL, TRIG, HDL, LDLDIRECT, LDLCALC, TSH, PSA, INR, GLUF, HGBA1C, MICROALBUR  CMP  No results found for: NA, K, CL, CO2, GLUCOSE, BUN, CREATININE, CALCIUM, PROT, ALBUMIN, AST, ALT, ALKPHOS, BILITOT, GFRNONAA, GFRAA  Assessment and Plan :  1. Depression Much improved. Pt scored a 2 on PHQ-9 today, a month  ago she was a 10. May consider coming off Sertraline after retirement. Follow up in 6 months.  2. Anxiety   I have done the exam and reviewed the above chart and it is accurate to the best of my knowledge.  Patient was seen and examined by Dr. Miguel Aschoff, and noted scribed by Webb Laws, [place] [facility] 09/17/2015 3:56 PM

## 2015-09-18 ENCOUNTER — Other Ambulatory Visit: Payer: Self-pay | Admitting: Family Medicine

## 2015-09-27 ENCOUNTER — Ambulatory Visit: Payer: BLUE CROSS/BLUE SHIELD

## 2015-10-04 ENCOUNTER — Ambulatory Visit
Admission: RE | Admit: 2015-10-04 | Discharge: 2015-10-04 | Disposition: A | Discharge status: Home / Self Care | Payer: BLUE CROSS/BLUE SHIELD | Source: Ambulatory Visit | Attending: Family Medicine | Admitting: Family Medicine

## 2015-10-04 ENCOUNTER — Other Ambulatory Visit: Payer: Self-pay | Admitting: Family Medicine

## 2015-10-04 DIAGNOSIS — Z1231 Encounter for screening mammogram for malignant neoplasm of breast: Secondary | ICD-10-CM | POA: Diagnosis present

## 2015-10-09 ENCOUNTER — Encounter: Payer: BLUE CROSS/BLUE SHIELD | Admitting: Obstetrics and Gynecology

## 2015-11-07 DIAGNOSIS — Z79899 Other long term (current) drug therapy: Secondary | ICD-10-CM | POA: Diagnosis not present

## 2015-11-14 DIAGNOSIS — G4733 Obstructive sleep apnea (adult) (pediatric): Secondary | ICD-10-CM | POA: Diagnosis not present

## 2015-12-04 DIAGNOSIS — M79671 Pain in right foot: Secondary | ICD-10-CM | POA: Diagnosis not present

## 2015-12-04 DIAGNOSIS — G8929 Other chronic pain: Secondary | ICD-10-CM | POA: Diagnosis not present

## 2016-03-19 ENCOUNTER — Ambulatory Visit: Payer: BLUE CROSS/BLUE SHIELD | Admitting: Family Medicine

## 2016-03-21 DIAGNOSIS — B300 Keratoconjunctivitis due to adenovirus: Secondary | ICD-10-CM | POA: Diagnosis not present

## 2016-04-01 ENCOUNTER — Ambulatory Visit (INDEPENDENT_AMBULATORY_CARE_PROVIDER_SITE_OTHER): Payer: BLUE CROSS/BLUE SHIELD | Admitting: Family Medicine

## 2016-04-01 VITALS — BP 102/78 | HR 76 | Temp 98.9°F | Resp 12 | Wt 229.0 lb

## 2016-04-01 DIAGNOSIS — K219 Gastro-esophageal reflux disease without esophagitis: Secondary | ICD-10-CM | POA: Diagnosis not present

## 2016-04-01 DIAGNOSIS — F419 Anxiety disorder, unspecified: Secondary | ICD-10-CM

## 2016-04-01 DIAGNOSIS — R739 Hyperglycemia, unspecified: Secondary | ICD-10-CM

## 2016-04-01 DIAGNOSIS — E785 Hyperlipidemia, unspecified: Secondary | ICD-10-CM

## 2016-04-01 DIAGNOSIS — F32A Depression, unspecified: Secondary | ICD-10-CM

## 2016-04-01 DIAGNOSIS — F329 Major depressive disorder, single episode, unspecified: Secondary | ICD-10-CM | POA: Diagnosis not present

## 2016-04-01 DIAGNOSIS — E669 Obesity, unspecified: Secondary | ICD-10-CM

## 2016-04-01 NOTE — Progress Notes (Signed)
Evelyn Hayes  MRN: [id number] DOB: 11-26-1955  Subjective:  HPI  Patient is here for 6 months follow up. GERD: Patient takes Omeprazole 3 to 4 times a week and symptoms are stable on that regimen.  Depression/Anxiety: Patient takes Zoloft. She went without this medication for 1 week by accident and could not tell a difference. Symptoms did not get worse. She is back on medication now but does not feel different.  Sleep apnea: Patient wears CPAP every night but lately has forgotten do to traveling for work. She does tolerate CPAP well and symptoms are improved when she wears it. She will go back to using it regularly. Patient Active Problem List   Diagnosis Date Noted  . 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 Adult-onset obesity 07/10/2015  . Allergic rhinitis 07/10/2015  . Anxiety 07/10/2015  . 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 Diabetes ([place]) 07/10/2015  . Abnormal liver enzymes 07/10/2015  . Acid reflux 07/10/2015  . Hypercholesteremia 07/10/2015  . Borderline diabetes 07/10/2015  . Apnea, sleep 07/10/2015  . Avitaminosis D 07/10/2015    Past Medical History:  Diagnosis Date  . Anxiety   . 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 GERD (gastroesophageal reflux disease)   . Knee cartilage, torn, right 2003  . Laryngopharyngeal reflux 12/2005    Social History   Social History  . Marital status: Married    Spouse name: N/A  . Number of children: N/A  . Years of education: N/A   Occupational History  . Not on file.   Social History Main Topics  . Smoking status: Former Research scientist (life sciences)  . Smokeless tobacco: Never Used     Comment: smoked for about 2 years in high school  . Alcohol use 2.0 oz/week    4 Standard drinks or equivalent per week     Comment: 2-4 drinks a week (beer or wine)  . Drug use: No  . Sexual activity: Yes    Partners: Male    Birth control/ protection: Post-menopausal   Other Topics  Concern  . Not on file   Social History Narrative  . No narrative on file    Outpatient Encounter Prescriptions as of 04/01/2016  Medication Sig  . fluticasone (FLONASE) 50 MCG/ACT nasal spray Place 2 sprays into both nostrils daily.  Marland Kitchen omeprazole (PRILOSEC) 40 MG capsule Take 1 capsule (40 mg total) by mouth daily.  . sertraline (ZOLOFT) 50 MG tablet Take 1 tablet (50 mg total) by mouth daily.   No facility-administered encounter medications on file as of 04/01/2016.     No Known Allergies  Review of Systems  Constitutional: Negative.   Eyes: Negative.   Respiratory: Negative.   Cardiovascular: Negative.   Gastrointestinal: Negative.   Musculoskeletal: Negative.   Skin: Negative.   Endo/Heme/Allergies: Negative.   Psychiatric/Behavioral: Negative.    Objective:  BP 102/78   Pulse 76   Temp 98.9 F (37.2 C)   Resp 12   Wt 229 lb (103.9 kg)   LMP 04/06/2008   BMI 40.57 kg/m   Physical Exam  Constitutional: She is oriented to person, place, and time and well-developed, well-nourished, and in no distress.  HENT:  Head: Normocephalic and atraumatic.  Eyes: Conjunctivae are normal. No scleral icterus.  Neck: No thyromegaly present.  Cardiovascular: Normal rate, regular rhythm and normal heart sounds.   Pulmonary/Chest: Effort normal and breath sounds normal.  Abdominal: Soft.  Lymphadenopathy:    She has no cervical adenopathy.  Neurological: She is alert and oriented to person, place, and time. Gait normal. GCS score is 15.  Skin: Skin is warm and dry.  Psychiatric: Mood, memory, affect and judgment normal.    Assessment and Plan :  1. Depression Stable.  2. Anxiety - TSH  3. Gastroesophageal reflux disease, esophagitis presence not specified Stable. - CBC w/Diff/Platelet - TSH  4. Hyperlipidemia - Comprehensive metabolic panel - Lipid Panel With LDL/HDL Ratio  5. Hyperglycemia - HgB A1c  6. Adult-onset obesity  HPI, Exam and A&P transcribed  under direction and in the presence of Miguel Aschoff, MD. I have done the exam and reviewed the chart and it is accurate to the best of my knowledge. Miguel Aschoff M.D. [facility]

## 2016-04-02 ENCOUNTER — Telehealth: Payer: Self-pay

## 2016-04-02 LAB — CBC WITH DIFFERENTIAL/PLATELET
Basophils Absolute: 0 x10E3/uL (ref 0.0–0.2)
Basos: 0 %
EOS (ABSOLUTE): 0.1 x10E3/uL (ref 0.0–0.4)
Eos: 2 %
Hematocrit: 40.3 % (ref 34.0–46.6)
Hemoglobin: 13.3 g/dL (ref 11.1–15.9)
Immature Grans (Abs): 0 x10E3/uL (ref 0.0–0.1)
Immature Granulocytes: 0 %
Lymphocytes Absolute: 2.4 x10E3/uL (ref 0.7–3.1)
Lymphs: 36 %
MCH: 28.1 pg (ref 26.6–33.0)
MCHC: 33 g/dL (ref 31.5–35.7)
MCV: 85 fL (ref 79–97)
Monocytes Absolute: 0.5 x10E3/uL (ref 0.1–0.9)
Monocytes: 7 %
Neutrophils Absolute: 3.7 x10E3/uL (ref 1.4–7.0)
Neutrophils: 55 %
Platelets: 225 x10E3/uL (ref 150–379)
RBC: 4.73 x10E6/uL (ref 3.77–5.28)
RDW: 14.1 % (ref 12.3–15.4)
WBC: 6.7 x10E3/uL (ref 3.4–10.8)

## 2016-04-02 LAB — HEMOGLOBIN A1C
Est. average glucose Bld gHb Est-mCnc: 120 mg/dL
Hgb A1c MFr Bld: 5.8 % — ABNORMAL HIGH (ref 4.8–5.6)

## 2016-04-02 LAB — LIPID PANEL WITH LDL/HDL RATIO
Cholesterol, Total: 180 mg/dL (ref 100–199)
HDL: 46 mg/dL
LDL Calculated: 116 mg/dL — ABNORMAL HIGH (ref 0–99)
LDl/HDL Ratio: 2.5 ratio (ref 0.0–3.2)
Triglycerides: 90 mg/dL (ref 0–149)
VLDL Cholesterol Cal: 18 mg/dL (ref 5–40)

## 2016-04-02 LAB — COMPREHENSIVE METABOLIC PANEL WITH GFR
ALT: 39 IU/L — ABNORMAL HIGH (ref 0–32)
AST: 26 IU/L (ref 0–40)
Albumin/Globulin Ratio: 1.8 (ref 1.2–2.2)
Albumin: 4.3 g/dL (ref 3.5–5.5)
Alkaline Phosphatase: 75 IU/L (ref 39–117)
BUN/Creatinine Ratio: 21 (ref 9–23)
BUN: 15 mg/dL (ref 6–24)
Bilirubin Total: 0.3 mg/dL (ref 0.0–1.2)
CO2: 25 mmol/L (ref 18–29)
Calcium: 9.3 mg/dL (ref 8.7–10.2)
Chloride: 103 mmol/L (ref 96–106)
Creatinine, Ser: 0.71 mg/dL (ref 0.57–1.00)
GFR calc Af Amer: 109 mL/min/1.73
GFR calc non Af Amer: 95 mL/min/1.73
Globulin, Total: 2.4 g/dL (ref 1.5–4.5)
Glucose: 102 mg/dL — ABNORMAL HIGH (ref 65–99)
Potassium: 4.6 mmol/L (ref 3.5–5.2)
Sodium: 142 mmol/L (ref 134–144)
Total Protein: 6.7 g/dL (ref 6.0–8.5)

## 2016-04-02 LAB — TSH: TSH: 1.91 u[IU]/mL (ref 0.450–4.500)

## 2016-04-02 NOTE — Telephone Encounter (Signed)
Pt advised.   Thanks,   -Shanara Schnieders  

## 2016-04-02 NOTE — Telephone Encounter (Signed)
-----   Message from Jerrol Banana., MD sent at 04/02/2016  8:27 AM EDT ----- Labs okay.

## 2016-08-20 DIAGNOSIS — G4733 Obstructive sleep apnea (adult) (pediatric): Secondary | ICD-10-CM | POA: Diagnosis not present

## 2016-08-27 ENCOUNTER — Encounter: Payer: Self-pay | Admitting: Obstetrics and Gynecology

## 2016-08-27 ENCOUNTER — Ambulatory Visit (INDEPENDENT_AMBULATORY_CARE_PROVIDER_SITE_OTHER): Payer: BLUE CROSS/BLUE SHIELD | Admitting: Obstetrics and Gynecology

## 2016-08-27 VITALS — BP 126/68 | HR 72 | Resp 16 | Ht 63.0 in | Wt 234.0 lb

## 2016-08-27 DIAGNOSIS — N819 Female genital prolapse, unspecified: Secondary | ICD-10-CM

## 2016-08-27 DIAGNOSIS — Z124 Encounter for screening for malignant neoplasm of cervix: Secondary | ICD-10-CM

## 2016-08-27 DIAGNOSIS — Z01419 Encounter for gynecological examination (general) (routine) without abnormal findings: Secondary | ICD-10-CM | POA: Diagnosis not present

## 2016-08-27 DIAGNOSIS — N3946 Mixed incontinence: Secondary | ICD-10-CM

## 2016-08-27 NOTE — Patient Instructions (Signed)

## 2016-08-27 NOTE — Progress Notes (Signed)
61 y.o. G3P3 MarriedCaucasianF here for annual exam.   H/O genital prolapse, she has a bulge all the time, tolerable. Worse with a cough. She has mixed incontinence, leaks about 1 x a week, if she gets too full. Leaks small amounts. Normal BM. Sexually active, no pain with intercourse. No vaginal bleeding.     Patient's last menstrual period was 04/06/2008.          Sexually active: Yes.    The current method of family planning is post menopausal status.    Exercising: Yes.    water aerobics Smoker:  Former smoker  Health Maintenance: Pap:  08-23-15 WNL NEG HR HPV 01-28-13 WNL NEG HR HPV History of abnormal Pap: yes 1997 LEEP MMG:  10-05-15 WNL  Colonoscopy:  2009 WNL per patient (at 65) BMD:   Never TDaP:  Unsure, will check with her primary Gardasil: N/A   reports that she has quit smoking. She has never used smokeless tobacco. She reports that she drinks about 2.0 oz of alcohol per week . She reports that she does not use drugs.3 grown kids, all local, has young grand children. She works in [employer], stressful job.   Past Medical History:  Diagnosis Date  . Anxiety   . 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 GERD (gastroesophageal reflux disease)   . Knee cartilage, torn, right 2003  . Laryngopharyngeal reflux 12/2005    Past Surgical History:  Procedure Laterality Date  . 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 COLPOSCOPY  1998  . hypo thermal ablation  06/02/05  . KNEE SURGERY  07/2004  . TONSILLECTOMY AND ADENOIDECTOMY    . WISDOM TOOTH EXTRACTION      Current Outpatient Prescriptions  Medication Sig Dispense Refill  . fluticasone (FLONASE) 50 MCG/ACT nasal spray Place 2 sprays into both nostrils daily. 48 g 3  . omeprazole (PRILOSEC) 40 MG capsule Take 1 capsule (40 mg total) by mouth daily. 90 capsule 3  . sertraline (ZOLOFT) 50 MG tablet Take 1 tablet (50 mg total) by mouth daily. 90 tablet 3   No current  facility-administered medications for this visit.     Family History  Problem Relation Age of Onset  . Osteoporosis Maternal Grandmother   . Hypertension Mother   . Heart disease Father   . Ovarian cancer Cousin 75  . Breast cancer Other   Her maternal 1st cousin had ovarian cancer at 75. She had 3 kids.  She had a paternal great aunt with breast cancer in her late 68's-60's  Review of Systems  Constitutional: Negative.   HENT: Negative.   Eyes: Negative.   Respiratory: Negative.   Cardiovascular: Negative.   Gastrointestinal: Negative.   Endocrine: Negative.   Genitourinary: Negative.   Musculoskeletal: Negative.   Skin: Negative.   Allergic/Immunologic: Negative.   Neurological: Negative.   Psychiatric/Behavioral: Negative.     Exam:   BP 126/68 (BP Location: Right Arm, Patient Position: Sitting, Cuff Size: Large)   Pulse 72   Resp 16   Ht 5\' 3"  (1.6 m)   Wt 234 lb (106.1 kg)   LMP 04/06/2008   BMI 41.45 kg/m   Weight change: @WEIGHTCHANGE @ Height:   Height: 5\' 3"  (160 cm)  Ht Readings from Last 3 Encounters:  08/27/16 5\' 3"  (1.6 m)  08/22/15 5\' 3"  (1.6 m)  07/10/15 5\' 3"  (1.6 m)    General appearance: alert, cooperative and appears stated age Head:  Normocephalic, without obvious abnormality, atraumatic Neck: no adenopathy, supple, symmetrical, trachea midline and thyroid normal to inspection and palpation Lungs: clear to auscultation bilaterally Cardiovascular: regular rate and rhythm Breasts: normal appearance, no masses or tenderness Heart: regular rate and rhythm Abdomen: soft, non-tender; bowel sounds normal; no masses,  no organomegaly Extremities: extremities normal, atraumatic, no cyanosis or edema Skin: Skin color, texture, turgor normal. No rashes or lesions Lymph nodes: Cervical, supraclavicular, and axillary nodes normal. No abnormal inguinal nodes palpated Neurologic: Grossly normal   Pelvic: External genitalia:  no lesions               Urethra:  normal appearing urethra with no masses, tenderness or lesions              Bartholins and Skenes: normal                 Vagina: normal appearing vagina with a grade 2 cystocele, grade 1-2 uterine prolapse and grade 1-2 rectocele (only examined supine with and without valsalva)              Cervix: no lesions               Bimanual Exam:  Uterus:  normal size, contour, position, consistency, mobility, non-tender              Adnexa: no mass, fullness, tenderness               Rectovaginal: Confirms               Anus:  normal sphincter tone, no lesions  Chaperone was present for exam.  A:  Well Woman with normal exam  Genital prolapse, tolerable. She will let me know if it is bothering her  Mixed urinary incontinence, tolerable and stable  Family history of a 1st cousin with ovarian cancer  P:   Pap with reflex testing  Mammogram  Colonoscopy in 2019  Discussed breast self exam  Discussed calcium and vit D intake  Discussed limitations of screening for ovarian cancer, offered appointment with Genetics

## 2016-08-28 LAB — IPS PAP TEST WITH REFLEX TO HPV

## 2016-09-13 ENCOUNTER — Other Ambulatory Visit: Payer: Self-pay | Admitting: Family Medicine

## 2016-09-13 DIAGNOSIS — K219 Gastro-esophageal reflux disease without esophagitis: Secondary | ICD-10-CM

## 2016-09-30 ENCOUNTER — Ambulatory Visit (INDEPENDENT_AMBULATORY_CARE_PROVIDER_SITE_OTHER): Payer: BLUE CROSS/BLUE SHIELD | Admitting: Family Medicine

## 2016-09-30 ENCOUNTER — Encounter: Payer: Self-pay | Admitting: Family Medicine

## 2016-09-30 DIAGNOSIS — R0602 Shortness of breath: Secondary | ICD-10-CM | POA: Diagnosis not present

## 2016-09-30 DIAGNOSIS — F419 Anxiety disorder, unspecified: Secondary | ICD-10-CM

## 2016-09-30 DIAGNOSIS — Z0000 Encounter for general adult medical examination without abnormal findings: Secondary | ICD-10-CM | POA: Diagnosis not present

## 2016-09-30 DIAGNOSIS — R739 Hyperglycemia, unspecified: Secondary | ICD-10-CM

## 2016-09-30 DIAGNOSIS — Z23 Encounter for immunization: Secondary | ICD-10-CM

## 2016-09-30 MED ORDER — SERTRALINE HCL 100 MG PO TABS: 100.0000 mg | ORAL_TABLET | Freq: Every day | ORAL | 3 refills | Status: DC

## 2016-09-30 NOTE — Progress Notes (Signed)
[name], Female    DOB: [birth year], 61 y.o.   MRN: [id number] Visit Date: 09/30/2016  Today's Provider: Wilhemena Durie, MD   Chief Complaint  Patient presents with  . Annual Exam  . Anxiety    "stress"   Subjective:    Annual physical exam Evelyn Hayes is a 61 y.o. female who presents today for health maintenance and complete physical. She feels well. She reports exercising about 3 times a week, water aerobics. She reports she is sleeping well. Pt runs [employer] for [employer] 30 years on the job.Yougest duaghter getting married this year. She is bipolar. She wears CPAP nightly. ----------------------------------------------------------------- Mammogram- 10/05/15 Colonoscopy- 09/06/10 diverticula small hemorrhoid repeat 10 years Pap- 08/27/16 neg. Done by GYN    Immunization History  Administered Date(s) Administered  . Influenza Split 06/19/2005  . Influenza,inj,Quad PF,36+ Mos 07/03/2014  . Tdap 06/19/2005   Pt would like to discuss increasing her Sertraline. She has had increased stress lately and scored a 8 on her PHQ-9.   Review of Systems  Constitutional: Negative.   HENT: Negative.   Eyes: Negative.   Respiratory: Negative.   Cardiovascular: Negative.   Gastrointestinal: Negative.   Endocrine: Negative.   Genitourinary: Negative.   Musculoskeletal: Positive for arthralgias and gait problem.       Bilateral knee pain,R>L.  Skin: Negative.   Allergic/Immunologic: Negative.   Hematological: Negative.   Psychiatric/Behavioral: The patient is nervous/anxious (stress).     Social History      She  reports that she has quit smoking. She has never used smokeless tobacco. She reports that she drinks about 2.0 oz of alcohol per week . She reports that she does not use drugs.       Social History   Social History  . Marital status: Married    Spouse name: N/A  . Number of children: N/A  . Years of education: N/A    Social History Main Topics  . Smoking status: Former Research scientist (life sciences)  . Smokeless tobacco: Never Used     Comment: smoked for about 2 years in high school  . Alcohol use 2.0 oz/week    4 Standard drinks or equivalent per week     Comment: 2-4 drinks a week (beer or wine)  . Drug use: No  . Sexual activity: Yes    Partners: Male    Birth control/ protection: Post-menopausal   Other Topics Concern  . None   Social History Narrative  . None    Past Medical History:  Diagnosis Date  . Anxiety   . 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 GERD (gastroesophageal reflux disease)   . Knee cartilage, torn, right 2003  . Laryngopharyngeal reflux 12/2005     Patient Active Problem List   Diagnosis Date Noted  . 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 Adult-onset obesity 07/10/2015  . Allergic rhinitis 07/10/2015  . Anxiety 07/10/2015  . 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 Diabetes ([place]) 07/10/2015  . Abnormal liver enzymes 07/10/2015  . Acid reflux 07/10/2015  . Hypercholesteremia 07/10/2015  . Borderline diabetes 07/10/2015  . Apnea, sleep 07/10/2015  . Avitaminosis D 07/10/2015    Past Surgical History:  Procedure Laterality Date  . 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 COLPOSCOPY  1998  . hypo thermal ablation  06/02/05  . KNEE SURGERY  07/2004  . TONSILLECTOMY  AND ADENOIDECTOMY    . WISDOM TOOTH EXTRACTION      Family History        Family Status  Relation Status  . Mother Alive  . Father Deceased  . Other Alive  . Maternal Grandmother   . Cousin         Her family history includes Breast cancer in her other; Heart disease in her father; Hypertension in her mother; Osteoporosis in her maternal grandmother; Ovarian cancer (age of onset: 93) in her cousin.     No Known Allergies   Current Outpatient Prescriptions:  .  fluticasone (FLONASE) 50 MCG/ACT nasal spray, Place 2 sprays into both  nostrils daily., Disp: 48 g, Rfl: 3 .  omeprazole (PRILOSEC) 40 MG capsule, TAKE 1 CAPSULE DAILY, Disp: 90 capsule, Rfl: 3 .  sertraline (ZOLOFT) 50 MG tablet, Take 1 tablet (50 mg total) by mouth daily., Disp: 90 tablet, Rfl: 3   Patient Care Team: Jerrol Banana., MD as PCP - General (Family Medicine)      Objective:   Vitals: LMP 04/06/2008   There were no vitals filed for this visit.   Physical Exam  Constitutional: She is oriented to person, place, and time. She appears well-developed and well-nourished.  HENT:  Head: Normocephalic and atraumatic.  Right Ear: External ear normal.  Left Ear: External ear normal.  Nose: Nose normal.  Mouth/Throat: Oropharynx is clear and moist.  Eyes: Conjunctivae and EOM are normal. Pupils are equal, round, and reactive to light.  Neck: Normal range of motion. Neck supple.  Cardiovascular: Normal rate, regular rhythm, normal heart sounds and intact distal pulses.   Pulmonary/Chest: Effort normal and breath sounds normal.  Abdominal: Soft. Bowel sounds are normal.  Musculoskeletal: Normal range of motion.  Neurological: She is alert and oriented to person, place, and time. She has normal reflexes.  Skin: Skin is warm and dry.  Psychiatric: She has a normal mood and affect. Her behavior is normal. Judgment and thought content normal.     Depression Screen PHQ 2/9 Scores 09/30/2016 07/10/2015  PHQ - 2 Score 2 2  PHQ- 9 Score 8 10      Assessment & Plan:     Routine Health Maintenance and Physical Exam  Exercise Activities and Dietary recommendations Goals    None      Immunization History  Administered Date(s) Administered  . Influenza Split 06/19/2005  . Influenza,inj,Quad PF,36+ Mos 07/03/2014  . Tdap 06/19/2005    Health Maintenance  Topic Date Due  . Hepatitis C Screening  [birth year]  . PNEUMOCOCCAL POLYSACCHARIDE VACCINE (1) 04/13/1960  . FOOT EXAM  04/13/1968  . OPHTHALMOLOGY EXAM  04/13/1968  . URINE  MICROALBUMIN  04/13/1968  . HIV Screening  04/13/1973  . TETANUS/TDAP  04/13/1977  . HEMOGLOBIN A1C  09/29/2016  . INFLUENZA VACCINE  03/02/2017 (Originally 02/05/2016)  . MAMMOGRAM  10/03/2017  . COLONOSCOPY  11/15/2018  . PAP SMEAR  08/28/2019     Discussed health benefits of physical activity, and encouraged her to engage in regular exercise appropriate for her age and condition.  Osteoarthritis--knees and thumb Obesity    --------------------------------------------------------------------   I have done the exam and reviewed the above chart and it is accurate to the best of my knowledge. Development worker, community has been used in this note in any air is in the dictation or transcription are unintentional.  Wilhemena Durie, MD  [place]

## 2016-10-01 LAB — LIPID PANEL WITH LDL/HDL RATIO
Cholesterol, Total: 194 mg/dL (ref 100–199)
HDL: 49 mg/dL (ref >39)
LDL Calculated: 118 mg/dL — ABNORMAL HIGH (ref 0–99)
LDl/HDL Ratio: 2.4 ratio units (ref 0.0–3.2)
Triglycerides: 133 mg/dL (ref 0–149)
VLDL Cholesterol Cal: 27 mg/dL (ref 5–40)

## 2016-10-01 LAB — COMPREHENSIVE METABOLIC PANEL
ALT: 41 IU/L — ABNORMAL HIGH (ref 0–32)
AST: 28 IU/L (ref 0–40)
Albumin/Globulin Ratio: 2 (ref 1.2–2.2)
Albumin: 4.6 g/dL (ref 3.5–5.5)
Alkaline Phosphatase: 83 IU/L (ref 39–117)
BUN/Creatinine Ratio: 24 — ABNORMAL HIGH (ref 9–23)
BUN: 17 mg/dL (ref 6–24)
Bilirubin Total: 0.4 mg/dL (ref 0.0–1.2)
CO2: 28 mmol/L (ref 18–29)
Calcium: 9.4 mg/dL (ref 8.7–10.2)
Chloride: 100 mmol/L (ref 96–106)
Creatinine, Ser: 0.71 mg/dL (ref 0.57–1.00)
GFR calc Af Amer: 109 mL/min/{1.73_m2} (ref >59)
GFR calc non Af Amer: 94 mL/min/{1.73_m2} (ref >59)
Globulin, Total: 2.3 g/dL (ref 1.5–4.5)
Glucose: 101 mg/dL — ABNORMAL HIGH (ref 65–99)
Potassium: 4.6 mmol/L (ref 3.5–5.2)
Sodium: 142 mmol/L (ref 134–144)
Total Protein: 6.9 g/dL (ref 6.0–8.5)

## 2016-10-01 LAB — CBC WITH DIFFERENTIAL/PLATELET
Basophils Absolute: 0 10*3/uL (ref 0.0–0.2)
Basos: 0 %
EOS (ABSOLUTE): 0.1 10*3/uL (ref 0.0–0.4)
Eos: 2 %
Hematocrit: 42.6 % (ref 34.0–46.6)
Hemoglobin: 14.3 g/dL (ref 11.1–15.9)
Immature Grans (Abs): 0 10*3/uL (ref 0.0–0.1)
Immature Granulocytes: 0 %
Lymphocytes Absolute: 2.5 10*3/uL (ref 0.7–3.1)
Lymphs: 33 %
MCH: 28.5 pg (ref 26.6–33.0)
MCHC: 33.6 g/dL (ref 31.5–35.7)
MCV: 85 fL (ref 79–97)
Monocytes Absolute: 0.5 10*3/uL (ref 0.1–0.9)
Monocytes: 6 %
Neutrophils Absolute: 4.6 10*3/uL (ref 1.4–7.0)
Neutrophils: 59 %
Platelets: 241 10*3/uL (ref 150–379)
RBC: 5.01 x10E6/uL (ref 3.77–5.28)
RDW: 14.2 % (ref 12.3–15.4)
WBC: 7.8 10*3/uL (ref 3.4–10.8)

## 2016-10-01 LAB — HEMOGLOBIN A1C
Est. average glucose Bld gHb Est-mCnc: 134 mg/dL
Hgb A1c MFr Bld: 6.3 % — ABNORMAL HIGH (ref 4.8–5.6)

## 2016-10-01 LAB — TSH: TSH: 1.66 u[IU]/mL (ref 0.450–4.500)

## 2016-10-16 ENCOUNTER — Other Ambulatory Visit: Payer: Self-pay | Admitting: Family Medicine

## 2016-10-16 DIAGNOSIS — Z1231 Encounter for screening mammogram for malignant neoplasm of breast: Secondary | ICD-10-CM

## 2016-10-21 DIAGNOSIS — D485 Neoplasm of uncertain behavior of skin: Secondary | ICD-10-CM | POA: Diagnosis not present

## 2016-10-21 DIAGNOSIS — L905 Scar conditions and fibrosis of skin: Secondary | ICD-10-CM | POA: Diagnosis not present

## 2016-10-21 DIAGNOSIS — D2362 Other benign neoplasm of skin of left upper limb, including shoulder: Secondary | ICD-10-CM | POA: Diagnosis not present

## 2016-10-21 DIAGNOSIS — L601 Onycholysis: Secondary | ICD-10-CM | POA: Diagnosis not present

## 2016-10-21 DIAGNOSIS — Z85828 Personal history of other malignant neoplasm of skin: Secondary | ICD-10-CM | POA: Diagnosis not present

## 2016-10-21 DIAGNOSIS — Z872 Personal history of diseases of the skin and subcutaneous tissue: Secondary | ICD-10-CM | POA: Diagnosis not present

## 2016-11-12 ENCOUNTER — Ambulatory Visit
Admission: RE | Admit: 2016-11-12 | Discharge: 2016-11-12 | Disposition: A | Discharge status: Home / Self Care | Payer: BLUE CROSS/BLUE SHIELD | Source: Ambulatory Visit | Attending: Family Medicine | Admitting: Family Medicine

## 2016-11-12 ENCOUNTER — Ambulatory Visit: Payer: BLUE CROSS/BLUE SHIELD | Admitting: Family Medicine

## 2016-11-12 DIAGNOSIS — Z1231 Encounter for screening mammogram for malignant neoplasm of breast: Secondary | ICD-10-CM | POA: Diagnosis not present

## 2016-11-19 ENCOUNTER — Ambulatory Visit: Payer: BLUE CROSS/BLUE SHIELD | Admitting: Family Medicine

## 2016-11-26 ENCOUNTER — Ambulatory Visit: Payer: BLUE CROSS/BLUE SHIELD | Admitting: Family Medicine

## 2016-12-16 ENCOUNTER — Ambulatory Visit: Payer: BLUE CROSS/BLUE SHIELD | Admitting: Family Medicine

## 2016-12-29 ENCOUNTER — Ambulatory Visit: Payer: Self-pay | Admitting: Family Medicine

## 2017-01-19 ENCOUNTER — Ambulatory Visit (INDEPENDENT_AMBULATORY_CARE_PROVIDER_SITE_OTHER): Payer: BLUE CROSS/BLUE SHIELD | Admitting: Family Medicine

## 2017-01-19 VITALS — BP 124/72 | HR 64 | Temp 98.5°F | Resp 14 | Wt 225.0 lb

## 2017-01-19 DIAGNOSIS — F419 Anxiety disorder, unspecified: Secondary | ICD-10-CM | POA: Diagnosis not present

## 2017-01-19 DIAGNOSIS — Z6839 Body mass index (BMI) 39.0-39.9, adult: Secondary | ICD-10-CM | POA: Diagnosis not present

## 2017-01-19 DIAGNOSIS — R739 Hyperglycemia, unspecified: Secondary | ICD-10-CM

## 2017-01-19 DIAGNOSIS — K219 Gastro-esophageal reflux disease without esophagitis: Secondary | ICD-10-CM | POA: Diagnosis not present

## 2017-01-19 NOTE — Progress Notes (Signed)
Evelyn Hayes  MRN: [id number] DOB: 1955/08/27  Subjective:  HPI  Patient is here for follow up. Last office visit was on 09/30/16 for CPE. Anxiety was addressed and Sertraline was increased to 100 mg daily. Patient states she is doing better on the higher dose. Patient also has had change in her job, less stress now. Patient has worked on weight loss, doing water aerobics 3 days a week. Wt Readings from Last 3 Encounters:  01/19/17 225 lb (102.1 kg)  08/27/16 234 lb (106.1 kg)  04/01/16 229 lb (103.9 kg)   Depression screen [facility] 2/9 01/19/2017 09/30/2016 07/10/2015  Decreased Interest 0 1 1  Down, Depressed, Hopeless 0 1 1  PHQ - 2 Score 0 2 2  Altered sleeping 0 0 1  Tired, decreased energy 0 2 2  Change in appetite 0 3 3  Feeling bad or failure about yourself  1 1 1   Trouble concentrating 0 0 1  Moving slowly or fidgety/restless 0 0 0  Suicidal thoughts 0 0 0  PHQ-9 Score 1 8 10   Difficult doing work/chores - - Somewhat difficult   Patient Active Problem List   Diagnosis Date Noted  . 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 Adult-onset obesity 07/10/2015  . Allergic rhinitis 07/10/2015  . Anxiety 07/10/2015  . 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 Abnormal liver enzymes 07/10/2015  . Acid reflux 07/10/2015  . Hypercholesteremia 07/10/2015  . Borderline diabetes 07/10/2015  . Apnea, sleep 07/10/2015  . Avitaminosis D 07/10/2015    Past Medical History:  Diagnosis Date  . Anxiety   . 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 GERD (gastroesophageal reflux disease)   . Knee cartilage, torn, right 2003  . Laryngopharyngeal reflux 12/2005    Social History   Social History  . Marital status: Married    Spouse name: N/A  . Number of children: N/A  . Years of education: N/A   Occupational History  . Not on file.   Social History Main Topics  . Smoking status: Former Research scientist (life sciences)  . Smokeless tobacco: Never Used     Comment: smoked for about 2 years in high school  . Alcohol use 2.0 oz/week    4 Standard drinks or equivalent per week     Comment: 2-4 drinks a week (beer or wine)  . Drug use: No  . Sexual activity: Yes    Partners: Male    Birth control/ protection: Post-menopausal   Other Topics Concern  . Not on file   Social History Narrative  . No narrative on file    Outpatient Encounter Prescriptions as of 01/19/2017  Medication Sig  . Biotin 10 MG CAPS Take by mouth daily.  . fluticasone (FLONASE) 50 MCG/ACT nasal spray Place 2 sprays into both nostrils daily.  Marland Kitchen omeprazole (PRILOSEC) 40 MG capsule TAKE 1 CAPSULE DAILY  . sertraline (ZOLOFT) 100 MG tablet Take 1 tablet (100 mg total) by mouth daily.   No facility-administered encounter medications on file as of 01/19/2017.     No Known Allergies  Review of Systems  Constitutional: Negative.   Eyes: Negative.   Respiratory: Negative.   Cardiovascular: Negative.   Gastrointestinal: Negative.   Musculoskeletal: Positive for joint pain (right foot bone spur). Negative for back pain and neck pain.  Skin: Negative.   Neurological: Negative.   Endo/Heme/Allergies: Negative.   Psychiatric/Behavioral: The patient is nervous/anxious.        Improved anxiety.  Objective:  BP 124/72   Pulse 64   Temp 98.5 F (36.9 C)   Resp 14   Wt 225 lb (102.1 kg)   LMP 04/06/2008   BMI 39.86 kg/m   Physical Exam  Constitutional: She is oriented to person, place, and time and well-developed, well-nourished, and in no distress.  HENT:  Head: Normocephalic and atraumatic.  Eyes: Pupils are equal, round, and reactive to light. Conjunctivae are normal.  Neck: Normal range of motion. Neck supple.  Cardiovascular: Normal rate, regular rhythm, normal heart sounds and intact distal pulses.  Exam reveals no gallop.   No murmur heard. Pulmonary/Chest: Effort normal and breath sounds normal. No respiratory distress. She has no wheezes.    Neurological: She is alert and oriented to person, place, and time.   Assessment and Plan :  1. Anxiety Better. Continue current medication. May discuss decreasing Zoloft dose in the future . Patient is doing very well at this time.  2. Gastroesophageal reflux disease, esophagitis presence not specified Stable.  3. BMI 39.0-39.9,adult Patient is working on habits and exercise. Patient lost 10 lbs intentionally.  4. Hyperglycemia Will check A1C on the next visit.  HPI, Exam and A&P transcribed by Theressa Millard, RMA under direction and in the presence of Miguel Aschoff, MD. I have done the exam and reviewed the chart and it is accurate to the best of my knowledge. Development worker, community has been used and  any errors in dictation or transcription are unintentional. Miguel Aschoff M.D. [facility]

## 2017-04-14 ENCOUNTER — Ambulatory Visit: Payer: BLUE CROSS/BLUE SHIELD | Admitting: Family Medicine

## 2017-06-18 ENCOUNTER — Encounter: Payer: Self-pay | Admitting: Obstetrics and Gynecology

## 2017-06-18 ENCOUNTER — Ambulatory Visit (INDEPENDENT_AMBULATORY_CARE_PROVIDER_SITE_OTHER): Payer: BLUE CROSS/BLUE SHIELD | Admitting: Obstetrics and Gynecology

## 2017-06-18 ENCOUNTER — Other Ambulatory Visit: Payer: Self-pay

## 2017-06-18 VITALS — BP 140/80 | HR 72 | Resp 14 | Wt 209.0 lb

## 2017-06-18 DIAGNOSIS — N814 Uterovaginal prolapse, unspecified: Secondary | ICD-10-CM

## 2017-06-18 DIAGNOSIS — N8111 Cystocele, midline: Secondary | ICD-10-CM

## 2017-06-18 DIAGNOSIS — N816 Rectocele: Secondary | ICD-10-CM

## 2017-06-18 DIAGNOSIS — R3915 Urgency of urination: Secondary | ICD-10-CM | POA: Diagnosis not present

## 2017-06-18 LAB — POCT URINALYSIS DIPSTICK
Appearance: NORMAL
Bilirubin, UA: NEGATIVE
Blood, UA: NEGATIVE
Glucose, UA: NEGATIVE
Ketones, UA: NEGATIVE
Leukocytes, UA: NEGATIVE
Nitrite, UA: NEGATIVE
Protein, UA: NEGATIVE
Urobilinogen, UA: NEGATIVE E.U./dL — AB
pH, UA: 7 (ref 5.0–8.0)

## 2017-06-18 NOTE — Progress Notes (Signed)
GYNECOLOGY  VISIT   HPI: 61 y.o.   Married  Caucasian  female   G3P3 with Patient's last menstrual period was 04/06/2008.   here c/o worsening vaginal prolapse   She was noted to have a grade 2 cystocele, a grade 1-2 uterine prolapse and grade 1-2 rectocele at her annual exam last February. At that time her symptoms were tolerable.  She has noticed an increase bulge and vaginal pressure in the last few months. Harder to have a BM. She is having a BM 1-2 x a day. Can vary from soft to firm.  She is voiding, the stream isn't as strong as it used to be, feels like she empties for the most part. Voids best first thing in the morning. She has a h/o GSI, small amount with a cold, worse with a full bladder. Currently leaks a few x month. She has some urge incontinence, occurring 2-3 x a week, usually small amounts, can be large amounts.   GYNECOLOGIC HISTORY: Patient's last menstrual period was 04/06/2008. Contraception:postmenopause  Menopausal hormone therapy: none        OB History    Gravida Para Term Preterm AB Living   3 3       3    SAB TAB Ectopic Multiple Live Births                     Patient Active Problem List   Diagnosis Date Noted  . 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 Adult-onset obesity 07/10/2015  . Allergic rhinitis 07/10/2015  . Anxiety 07/10/2015  . Abnormal liver enzymes 07/10/2015  . Acid reflux 07/10/2015  . Hypercholesteremia 07/10/2015  . Borderline diabetes 07/10/2015  . Apnea, sleep 07/10/2015  . Avitaminosis D 07/10/2015    Past Medical History:  Diagnosis Date  . Anxiety   . 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 GERD (gastroesophageal reflux disease)   . Knee cartilage, torn, right 2003  . Laryngopharyngeal reflux 12/2005    Past Surgical History:  Procedure Laterality Date  . 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 COLPOSCOPY  1998  . hypo thermal ablation  06/02/05  . KNEE SURGERY  07/2004  .  TONSILLECTOMY AND ADENOIDECTOMY    . WISDOM TOOTH EXTRACTION      Current Outpatient Medications  Medication Sig Dispense Refill  . Biotin 10 MG CAPS Take by mouth daily.    . fluticasone (FLONASE) 50 MCG/ACT nasal spray Place 2 sprays into both nostrils daily. 48 g 3  . omeprazole (PRILOSEC) 40 MG capsule TAKE 1 CAPSULE DAILY 90 capsule 3  . sertraline (ZOLOFT) 100 MG tablet Take 1 tablet (100 mg total) by mouth daily. 90 tablet 3   No current facility-administered medications for this visit.      ALLERGIES: Patient has no known allergies.  Family History  Problem Relation Age of Onset  . Hypertension Mother   . Heart disease Father   . Breast cancer Other   . Osteoporosis Maternal Grandmother   . Ovarian cancer Cousin 4    Social History   Socioeconomic History  . Marital status: Married    Spouse name: Not on file  . Number of children: Not on file  . Years of education: Not on file  . Highest education level: Not on file  Social Needs  . Financial resource strain: Not on file  . Food insecurity - worry: Not on file  . Food insecurity -  inability: Not on file  . Transportation needs - medical: Not on file  . Transportation needs - non-medical: Not on file  Occupational History  . Not on file  Tobacco Use  . Smoking status: Former Research scientist (life sciences)  . Smokeless tobacco: Never Used  . Tobacco comment: smoked for about 2 years in high school  Substance and Sexual Activity  . Alcohol use: Yes    Alcohol/week: 2.0 oz    Types: 4 Standard drinks or equivalent per week    Comment: 2-4 drinks a week (beer or wine)  . Drug use: No  . Sexual activity: Yes    Partners: Male    Birth control/protection: Post-menopausal  Other Topics Concern  . Not on file  Social History Narrative  . Not on file    Review of Systems  Constitutional: Negative.   HENT: Negative.   Eyes: Negative.   Respiratory: Negative.   Cardiovascular: Negative.   Gastrointestinal: Negative.    Genitourinary: Positive for urgency.       Vaginal prolapse   Musculoskeletal: Negative.   Skin: Negative.   Neurological: Negative.   Endo/Heme/Allergies: Negative.   Psychiatric/Behavioral: Negative.     PHYSICAL EXAMINATION:    BP 140/80 (BP Location: Right Arm, Patient Position: Sitting, Cuff Size: Normal)   Pulse 72   Resp 14   Wt 209 lb (94.8 kg)   LMP 04/06/2008   BMI 37.02 kg/m     General appearance: alert, cooperative and appears stated age  Pelvic: External genitalia:  no lesions              Urethra:  normal appearing urethra with no masses, tenderness or lesions              Bartholins and Skenes: normal                 Vagina: normal appearing vagina with normal color and discharge, no lesions  Grade 3 cystocele, grade 1uterine prolapse and grade 1-2 rectocele. Patient examined supine and standing, with and without valsalva. Minimal descent of the vaginal apex.               Cervix: no lesions              Bimanual Exam:  Uterus:  normal size, contour, position, consistency, mobility, non-tender              Adnexa: no mass, fullness, tenderness  Fitted with a #6 ring pessary with support, came out with standing and coughing. #7 ring pessary was uncomfortable. Fitted with a #4 cube pessary felt like it was going to fall out when she was voiding. Tried the #7 ring pessary again at patient's request. The second time she tried the #7, it was comfortable for her. She walked, coughed and voided with the pessary in.              Chaperone was present for exam.  ASSESSMENT Genital prolapse, more bothersome in the last few months.  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 Urine dip negative    PLAN Discussed options of do nothing, pessary and surgery If she had surgery I think she would be a candidate for vaginal or robotic surgery. The prolapse at her apex is currently mild We did discuss the risk of recurrent prolapse, up to 30% with the cystocele.  We also  discussed the risk of worsening urinary incontinence. Currently her main issue with incontinence is the urge incontinence.  For now she would like to try  the pessary. Will order the pessary and she will return for insertion.  Discussed that her stress incontinence could worsen with the pessary. If the urge is bothersome, could consider PT or medication   An After Visit Summary was printed and given to the patient.  Over 25 minutes face to face time of which over 50% was spent in counseling.

## 2017-06-19 ENCOUNTER — Telehealth: Payer: Self-pay

## 2017-06-19 ENCOUNTER — Encounter: Payer: Self-pay | Admitting: Obstetrics and Gynecology

## 2017-06-19 NOTE — Telephone Encounter (Signed)
Called patient and notified her pessary is here. Made appointment with Dr.Jertson on 06-23-17 at 11:30am for insertion.

## 2017-06-23 ENCOUNTER — Ambulatory Visit: Payer: BLUE CROSS/BLUE SHIELD | Admitting: Obstetrics and Gynecology

## 2017-07-07 DIAGNOSIS — J189 Pneumonia, unspecified organism: Secondary | ICD-10-CM

## 2017-07-07 HISTORY — DX: Pneumonia, unspecified organism: J18.9

## 2017-07-14 ENCOUNTER — Other Ambulatory Visit: Payer: Self-pay

## 2017-07-14 ENCOUNTER — Ambulatory Visit: Payer: BLUE CROSS/BLUE SHIELD | Admitting: Obstetrics and Gynecology

## 2017-07-14 ENCOUNTER — Encounter: Payer: Self-pay | Admitting: Obstetrics and Gynecology

## 2017-07-14 VITALS — BP 124/78 | HR 72 | Resp 16 | Wt 209.0 lb

## 2017-07-14 DIAGNOSIS — N819 Female genital prolapse, unspecified: Secondary | ICD-10-CM | POA: Diagnosis not present

## 2017-07-14 NOTE — Progress Notes (Signed)
GYNECOLOGY  VISIT   HPI: 62 y.o.   Married  Caucasian  female   G3P3 with Patient's last menstrual period was 04/06/2008.   here for pessary insertion. The patient has a grade 3 cystocele, grade 1 uterine prolapse and grade 1-2 rectocele. She was here for a pessary fitting last month. Here to have her #7 ring pessary with support inserted.   GYNECOLOGIC HISTORY: Patient's last menstrual period was 04/06/2008. Contraception:postmenopause  Menopausal hormone therapy: none         OB History    Gravida Para Term Preterm AB Living   3 3       3    SAB TAB Ectopic Multiple Live Births                     Patient Active Problem List   Diagnosis Date Noted  . 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 Adult-onset obesity 07/10/2015  . Allergic rhinitis 07/10/2015  . Anxiety 07/10/2015  . Abnormal liver enzymes 07/10/2015  . Acid reflux 07/10/2015  . Hypercholesteremia 07/10/2015  . Borderline diabetes 07/10/2015  . Apnea, sleep 07/10/2015  . Avitaminosis D 07/10/2015    Past Medical History:  Diagnosis Date  . Anxiety   . 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 GERD (gastroesophageal reflux disease)   . Knee cartilage, torn, right 2003  . Laryngopharyngeal reflux 12/2005    Past Surgical History:  Procedure Laterality Date  . 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 COLPOSCOPY  1998  . hypo thermal ablation  06/02/05  . KNEE SURGERY  07/2004  . TONSILLECTOMY AND ADENOIDECTOMY    . WISDOM TOOTH EXTRACTION      Current Outpatient Medications  Medication Sig Dispense Refill  . Biotin 10 MG CAPS Take by mouth daily.    . fluticasone (FLONASE) 50 MCG/ACT nasal spray Place 2 sprays into both nostrils daily. 48 g 3  . omeprazole (PRILOSEC) 40 MG capsule TAKE 1 CAPSULE DAILY 90 capsule 3  . sertraline (ZOLOFT) 100 MG tablet Take 1 tablet (100 mg total) by mouth daily. 90 tablet 3   No current facility-administered medications for this  visit.      ALLERGIES: Patient has no known allergies.  Family History  Problem Relation Age of Onset  . Hypertension Mother   . Heart disease Father   . Breast cancer Other   . Osteoporosis Maternal Grandmother   . Ovarian cancer Cousin 87    Social History   Socioeconomic History  . Marital status: Married    Spouse name: Not on file  . Number of children: Not on file  . Years of education: Not on file  . Highest education level: Not on file  Social Needs  . Financial resource strain: Not on file  . Food insecurity - worry: Not on file  . Food insecurity - inability: Not on file  . Transportation needs - medical: Not on file  . Transportation needs - non-medical: Not on file  Occupational History  . Not on file  Tobacco Use  . Smoking status: Former Research scientist (life sciences)  . Smokeless tobacco: Never Used  . Tobacco comment: smoked for about 2 years in high school  Substance and Sexual Activity  . Alcohol use: Yes    Alcohol/week: 2.0 oz    Types: 4 Standard drinks or equivalent per week    Comment: 2-4 drinks a week (beer or wine)  . Drug use: No  .  Sexual activity: Yes    Partners: Male    Birth control/protection: Post-menopausal  Other Topics Concern  . Not on file  Social History Narrative  . Not on file    Review of Systems  Constitutional: Negative.   HENT: Negative.   Eyes: Negative.   Respiratory: Negative.   Cardiovascular: Negative.   Gastrointestinal: Negative.   Genitourinary:       Loss of urine with sneeze/ cough   Musculoskeletal: Negative.   Skin: Negative.   Neurological: Negative.   Endo/Heme/Allergies: Negative.   Psychiatric/Behavioral: Negative.     PHYSICAL EXAMINATION:    BP 124/78 (BP Location: Right Arm, Patient Position: Sitting, Cuff Size: Normal)   Pulse 72   Resp 16   Wt 209 lb (94.8 kg)   LMP 04/06/2008   BMI 37.02 kg/m     General appearance: alert, cooperative and appears stated age  Pelvic: External genitalia:  no  lesions              Urethra:  normal appearing urethra with no masses, tenderness or lesions              Bartholins and Skenes: normal                 #7 ring pessary with support was inserted  Chaperone was present for exam.  ASSESSMENT Genital prolapse    PLAN #7 ring pessary with support placed F/U in one week Discussed how to remove and insert the pessary   An After Visit Summary was printed and given to the patient.

## 2017-07-22 ENCOUNTER — Telehealth: Payer: Self-pay | Admitting: Obstetrics and Gynecology

## 2017-07-22 ENCOUNTER — Ambulatory Visit: Payer: BLUE CROSS/BLUE SHIELD | Admitting: Obstetrics and Gynecology

## 2017-07-22 ENCOUNTER — Encounter: Payer: Self-pay | Admitting: Obstetrics and Gynecology

## 2017-07-22 NOTE — Telephone Encounter (Signed)
Patient [facility] her 1 week pessary check appointment today at 1:45pm. Patient did confirm this appointment yesterday. I left a message for her to call and reschedule.

## 2017-09-03 ENCOUNTER — Encounter: Payer: Self-pay | Admitting: Obstetrics and Gynecology

## 2017-09-03 ENCOUNTER — Ambulatory Visit (INDEPENDENT_AMBULATORY_CARE_PROVIDER_SITE_OTHER): Payer: BLUE CROSS/BLUE SHIELD | Admitting: Obstetrics and Gynecology

## 2017-09-03 ENCOUNTER — Other Ambulatory Visit: Payer: Self-pay

## 2017-09-03 VITALS — BP 132/80 | HR 72 | Resp 16 | Ht 63.0 in | Wt 210.0 lb

## 2017-09-03 DIAGNOSIS — N819 Female genital prolapse, unspecified: Secondary | ICD-10-CM | POA: Diagnosis not present

## 2017-09-03 DIAGNOSIS — Z01419 Encounter for gynecological examination (general) (routine) without abnormal findings: Secondary | ICD-10-CM | POA: Diagnosis not present

## 2017-09-03 NOTE — Progress Notes (Signed)
62 y.o. G3P3 MarriedCaucasianF here for annual exam.   The patient has a h/o a grade 3 cystocele, grade 1 uterine prolapse and grade 1-2 rectocele. She was fitted with a #7 ring pessary with support in 1/19. The pessary fell out with her first BM. She hasn't used it at all. She is intermittently bothered by her bulge, not all the time, no pain. She has mild GSI, only on occasion. Has urge incontinence leaks a few x a week. Leaks small amounts.  She has lost 25 lbs since last summer.  No vaginal bleeding, sexually active no pain.     Patient's last menstrual period was 04/06/2008.          Sexually active: Yes.    The current method of family planning is post menopausal status.    Exercising: Yes.    water aerobic/ bike  Smoker:  Former smoker  Health Maintenance: Pap:  08-27-16 WNL 08-23-15 WNL  NEG HR HPV  History of abnormal Pap:  Yes LEEP 1997 MMG:  11-12-16 WNL  Colonoscopy:  2009 normal - due this year BMD:   Never TDaP:  09-30-16  Gardasil: N/A   reports that she has quit smoking. she has never used smokeless tobacco. She reports that she drinks about 3.0 - 3.6 oz of alcohol per week. She reports that she does not use drugs. 3 grown kids, all local, has young grand children. She works in [employer], stressful job.  Past Medical History:  Diagnosis Date  . Anxiety   . 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 GERD (gastroesophageal reflux disease)   . Knee cartilage, torn, right 2003  . 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 Prediabetes     Past Surgical History:  Procedure Laterality Date  . 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 COLPOSCOPY  1998  . hypo thermal ablation  06/02/05  . KNEE SURGERY  07/2004  . TONSILLECTOMY AND ADENOIDECTOMY    . WISDOM TOOTH EXTRACTION      Current Outpatient Medications  Medication Sig Dispense Refill  . Biotin 10 MG CAPS Take by mouth daily.    . fluticasone (FLONASE) 50 MCG/ACT nasal  spray Place 2 sprays into both nostrils daily. 48 g 3  . omeprazole (PRILOSEC) 40 MG capsule TAKE 1 CAPSULE DAILY 90 capsule 3  . sertraline (ZOLOFT) 100 MG tablet Take 1 tablet (100 mg total) by mouth daily. 90 tablet 3   No current facility-administered medications for this visit.     Family History  Problem Relation Age of Onset  . Hypertension Mother   . Heart disease Father   . Breast cancer Other   . Osteoporosis Maternal Grandmother   . Ovarian cancer Cousin 70    Review of Systems  Constitutional: Negative.   HENT: Negative.   Eyes: Negative.   Respiratory: Negative.   Cardiovascular: Negative.   Gastrointestinal: Negative.   Endocrine: Negative.   Genitourinary:       Urine leakage   Musculoskeletal: Negative.   Skin: Negative.   Allergic/Immunologic: Negative.   Neurological: Negative.   Psychiatric/Behavioral: Negative.     Exam:   BP 132/80 (BP Location: Right Arm, Patient Position: Sitting, Cuff Size: Normal)   Pulse 72   Resp 16   Ht 5\' 3"  (1.6 m)   Wt 210 lb (95.3 kg)   LMP 04/06/2008   BMI 37.20 kg/m   Weight change: @WEIGHTCHANGE @ Height:   Height: 5\' 3"  (  160 cm)  Ht Readings from Last 3 Encounters:  09/03/17 5\' 3"  (1.6 m)  08/27/16 5\' 3"  (1.6 m)  08/22/15 5\' 3"  (1.6 m)    General appearance: alert, cooperative and appears stated age Head: Normocephalic, without obvious abnormality, atraumatic Neck: no adenopathy, supple, symmetrical, trachea midline and thyroid normal to inspection and palpation Lungs: clear to auscultation bilaterally Cardiovascular: regular rate and rhythm Breasts: normal appearance, no masses or tenderness Abdomen: soft, non-tender; non distended,  no masses,  no organomegaly Extremities: extremities normal, atraumatic, no cyanosis or edema Skin: Skin color, texture, turgor normal. No rashes or lesions Lymph nodes: Cervical, supraclavicular, and axillary nodes normal. No abnormal inguinal nodes palpated Neurologic:  Grossly normal   Pelvic: External genitalia:  no lesions              Urethra:  normal appearing urethra with no masses, tenderness or lesions              Bartholins and Skenes: normal                 Vagina: grade 3 cystocele with valsalva, grade one uterine prolapse and rectocele. Examined supine and standing with and without valsalva              Cervix: no lesions               Bimanual Exam:  Uterus:  normal size, contour, position, consistency, mobility, non-tender              Adnexa: no mass, fullness, tenderness               Rectovaginal: Confirms               Anus:  normal sphincter tone, no lesions  Chaperone was present for exam.  A:  Well Woman with normal exam  Mixed incontinence, urge worse than stress, recent negative urine culture  Genital prolapse, biggest issue is her cystocele    P:   No pap this year  Mammogram in 5/19  Colonoscopy this year  Discussed options with her prolapse of do nothing, using her pessary prn, trying a different pessary and surgery  She will try using her pessary intermittently (return for f/u if she is consistently doing this over a one month period of time)  We briefly discussed the option of surgery. Would be a good candidate for vaginal surgery (TVH/A&P repair/uterosacral ligament suspension)  We discussed that correcting her cystocele could worsen her incontinence  Discussed breast self exam  Discussed calcium and vit D intake  Labs with primary

## 2017-09-03 NOTE — Patient Instructions (Signed)
EXERCISE AND DIET:  We recommended that you start or continue a regular exercise program for good health. Regular exercise means any activity that makes your heart beat faster and makes you sweat.  We recommend exercising at least 30 minutes per day at least 3 days a week, preferably 4 or 5.  We also recommend a diet low in fat and sugar.  Inactivity, poor dietary choices and obesity can cause diabetes, heart attack, stroke, and kidney damage, among others.    ALCOHOL AND SMOKING:  Women should limit their alcohol intake to no more than 7 drinks/beers/glasses of wine (combined, not each!) per week. Moderation of alcohol intake to this level decreases your risk of breast cancer and liver damage. And of course, no recreational drugs are part of a healthy lifestyle.  And absolutely no smoking or even second hand smoke. Most people know smoking can cause heart and lung diseases, but did you know it also contributes to weakening of your bones? Aging of your skin?  Yellowing of your teeth and nails?  CALCIUM AND VITAMIN D:  Adequate intake of calcium and Vitamin D are recommended.  The recommendations for exact amounts of these supplements seem to change often, but generally speaking 600 mg of calcium (either carbonate or citrate) and 800 units of Vitamin D per day seems prudent. Certain women may benefit from higher intake of Vitamin D.  If you are among these women, your doctor will have told you during your visit.    PAP SMEARS:  Pap smears, to check for cervical cancer or precancers,  have traditionally been done yearly, although recent scientific advances have shown that most women can have pap smears less often.  However, every woman still should have a physical exam from her gynecologist every year. It will include a breast check, inspection of the vulva and vagina to check for abnormal growths or skin changes, a visual exam of the cervix, and then an exam to evaluate the size and shape of the uterus and  ovaries.  And after 60 years of age, a rectal exam is indicated to check for rectal cancers. We will also provide age appropriate advice regarding health maintenance, like when you should have certain vaccines, screening for sexually transmitted diseases, bone density testing, colonoscopy, mammograms, etc.   MAMMOGRAMS:  All women over 40 years old should have a yearly mammogram. Many facilities now offer a "3D" mammogram, which may cost around $50 extra out of pocket. If possible,  we recommend you accept the option to have the 3D mammogram performed.  It both reduces the number of women who will be called back for extra views which then turn out to be normal, and it is better than the routine mammogram at detecting truly abnormal areas.    COLONOSCOPY:  Colonoscopy to screen for colon cancer is recommended for all women at age 50.  We know, you hate the idea of the prep.  We agree, BUT, having colon cancer and not knowing it is worse!!  Colon cancer so often starts as a polyp that can be seen and removed at colonscopy, which can quite literally save your life!  And if your first colonoscopy is normal and you have no family history of colon cancer, most women don't have to have it again for 10 years.  Once every ten years, you can do something that may end up saving your life, right?  We will be happy to help you get it scheduled when you are ready.    Be sure to check your insurance coverage so you understand how much it will cost.  It may be covered as a preventative service at no cost, but you should check your particular policy.     About Cystocele  Overview  The pelvic organs, including the bladder, are normally supported by pelvic floor muscles and ligaments.  When these muscles and ligaments are stretched, weakened or torn, the wall between the bladder and the vagina sags or herniates causing a prolapse, sometimes called a cystocele.  This condition may cause discomfort and problems with emptying  the bladder.  It can be present in various stages.  Some people are not aware of the changes.  Others may notice changes at the vaginal opening or a feeling of the bladder dropping outside the body.  Causes of a Cystocele  A cystocele is usually caused by muscle straining or stretching during childbirth.  In addition, cystocele is more common after menopause, because the hormone estrogen helps keep the elastic tissues around the pelvic organs strong.  A cystocele is more likely to occur when levels of estrogen decrease.  Other causes include: heavy lifting, chronic coughing, previous pelvic surgery and obesity.  Symptoms  A bladder that has dropped from its normal position may cause: unwanted urine leakage (stress incontinence), frequent urination or urge to urinate, incomplete emptying of the bladder (not feeling bladder relief after emptying), pain or discomfort in the vagina, pelvis, groin, lower back or lower abdomen and frequent urinary tract infections.  Mild cases may not cause any symptoms.  Treatment Options  Pelvic floor (Kegel) exercises:  Strength training the muscles in your genital area  Behavioral changes: Treating and preventing constipation, taking time to empty your bladder properly, learning to lift properly and/or avoid heavy lifting when possible, stopping smoking, avoiding weight gain and treating a chronic cough or bronchitis.  A pessary: A vaginal support device is sometimes used to help pelvic support caused by muscle and ligament changes.  Surgery: Surgical repair may be necessary if symptoms cannot be managed with exercise, behavioral changes and a pessary.  Surgery is usually considered for severe cases.   2007, [employer] 

## 2017-09-08 IMAGING — MG MM DIGITAL SCREENING BILAT W/ TOMO W/ CAD
8 of 12 series · 8 of 28 positions shown · non-contrast
Comparison: Previous exam(s).

CLINICAL DATA: Screening.

EXAM:
2D DIGITAL SCREENING BILATERAL MAMMOGRAM WITH CAD AND ADJUNCT TOMO

[R CC]
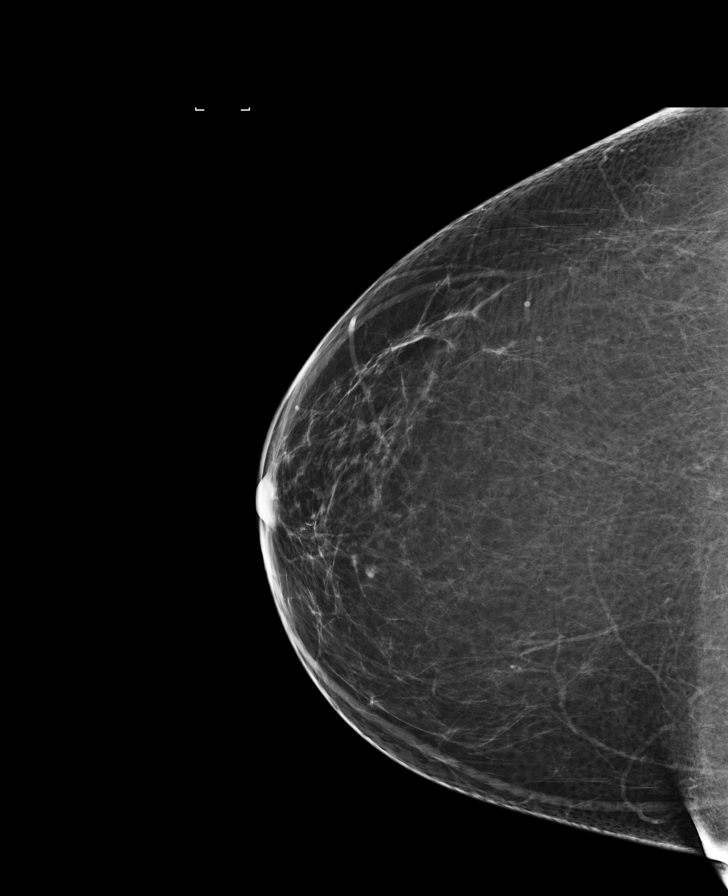

[R CC synth-2D]
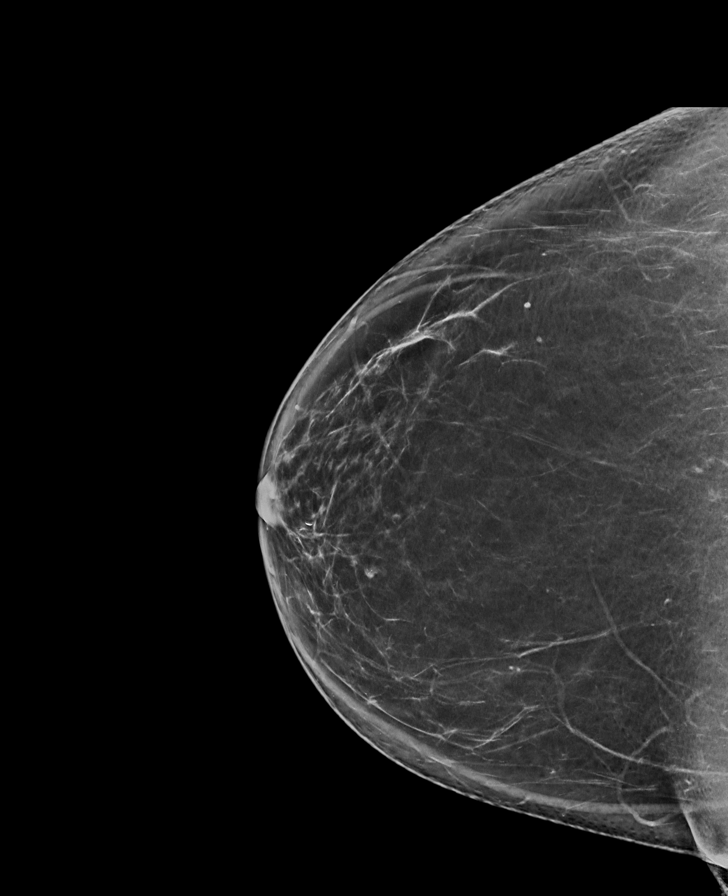

[R MLO synth-2D]
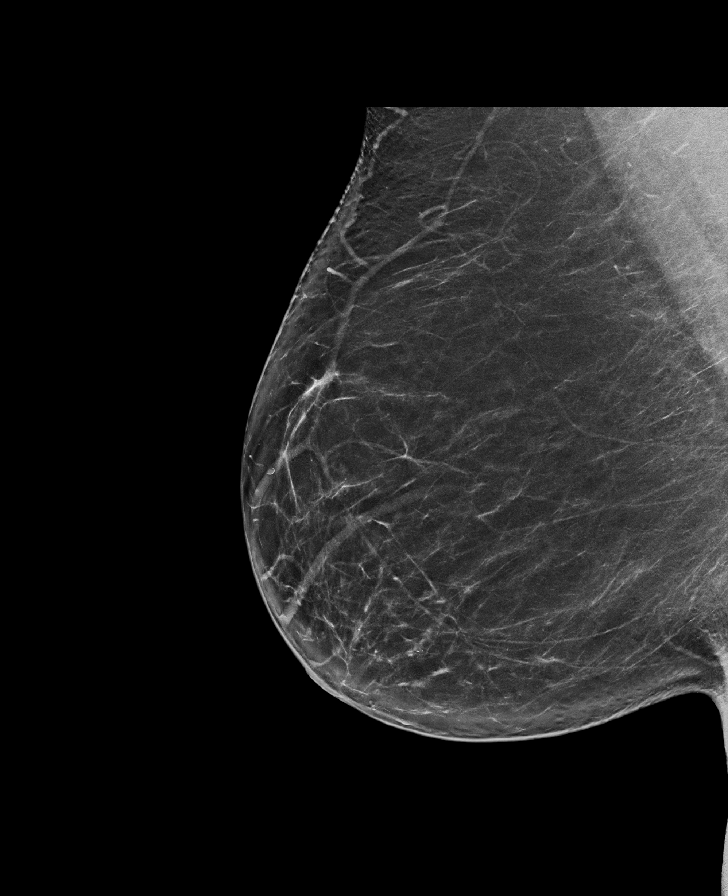

[L CC synth-2D]
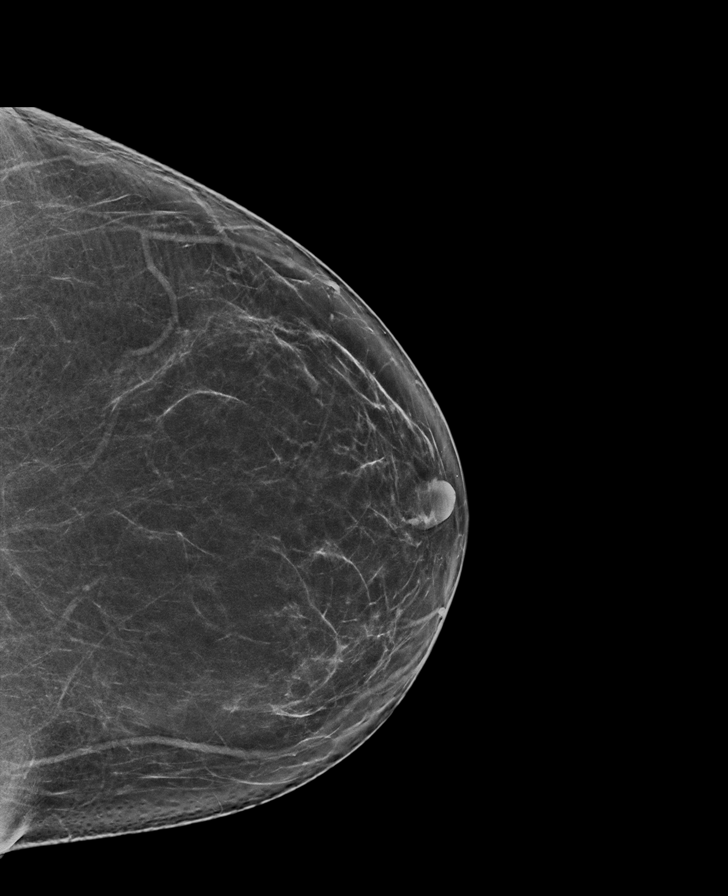

[L MLO synth-2D]
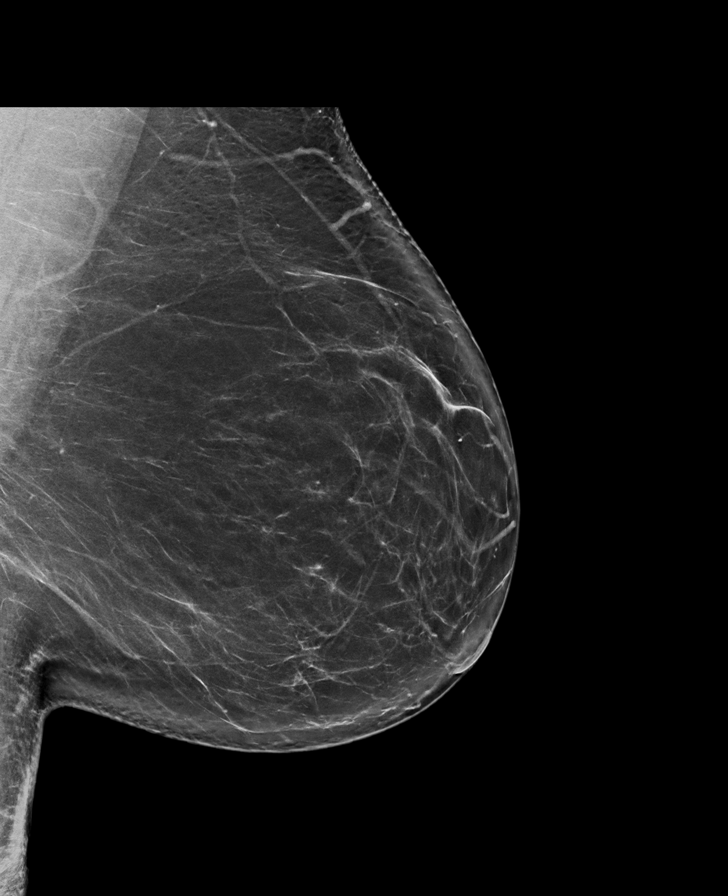

[L MLO]
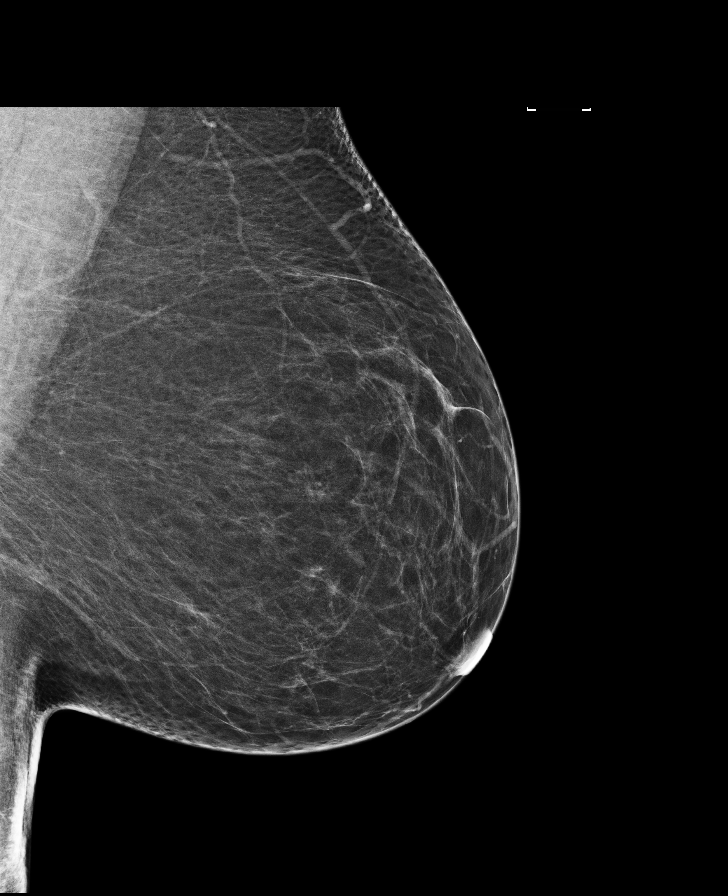

[R MLO]
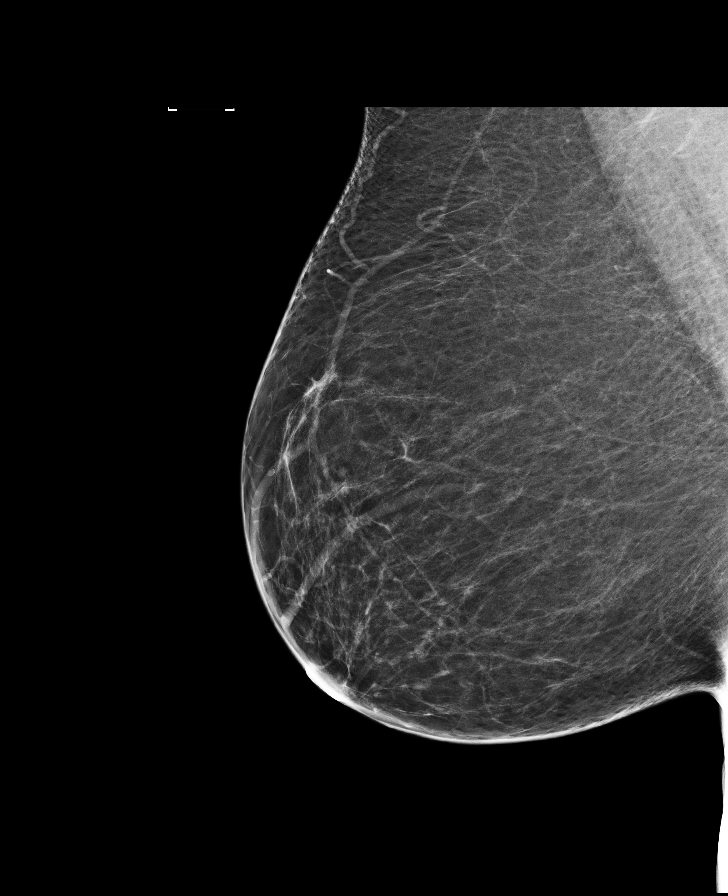

[L CC]
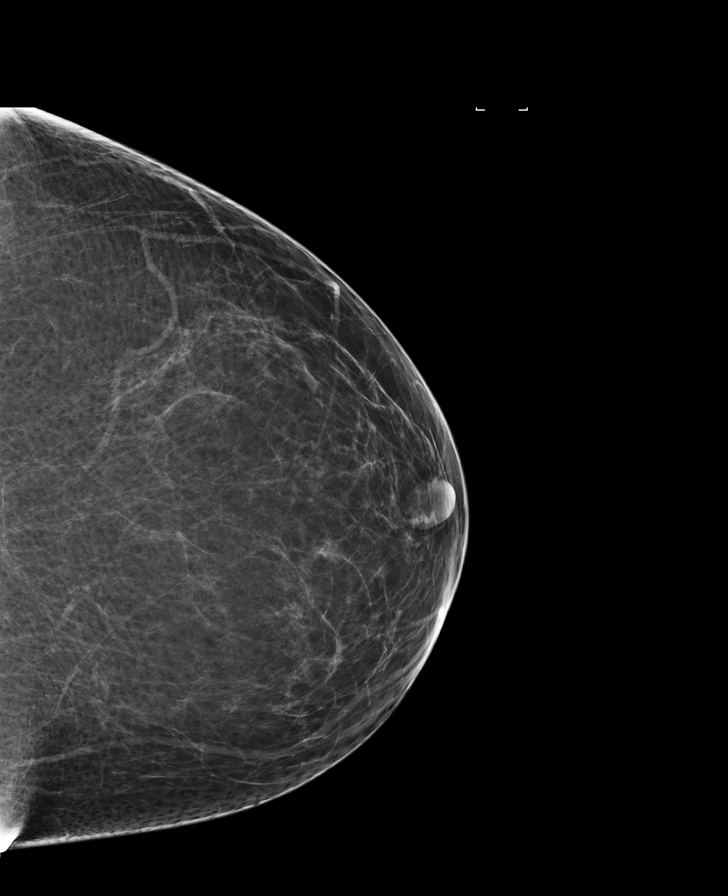

[8 of 28 positions shown; findings below may reference images not displayed]

ACR Breast Density Category b: There are scattered areas of
fibroglandular density.
FINDINGS: There are no findings suspicious for malignancy. Images were
processed with CAD.
IMPRESSION: No mammographic evidence of malignancy. A result letter of this
screening mammogram will be mailed directly to the patient.

RECOMMENDATION:
Screening mammogram in one year. (Code:97-6-RS4)

BI-RADS CATEGORY  1: Negative.

## 2017-09-23 DIAGNOSIS — Z1211 Encounter for screening for malignant neoplasm of colon: Secondary | ICD-10-CM | POA: Diagnosis not present

## 2017-09-23 DIAGNOSIS — K219 Gastro-esophageal reflux disease without esophagitis: Secondary | ICD-10-CM | POA: Diagnosis not present

## 2017-10-14 ENCOUNTER — Other Ambulatory Visit: Payer: Self-pay | Admitting: Family Medicine

## 2017-10-14 DIAGNOSIS — Z1231 Encounter for screening mammogram for malignant neoplasm of breast: Secondary | ICD-10-CM

## 2017-10-14 DIAGNOSIS — K219 Gastro-esophageal reflux disease without esophagitis: Secondary | ICD-10-CM

## 2017-10-14 DIAGNOSIS — F419 Anxiety disorder, unspecified: Secondary | ICD-10-CM

## 2017-11-09 DIAGNOSIS — L578 Other skin changes due to chronic exposure to nonionizing radiation: Secondary | ICD-10-CM | POA: Diagnosis not present

## 2017-11-09 DIAGNOSIS — Z872 Personal history of diseases of the skin and subcutaneous tissue: Secondary | ICD-10-CM | POA: Diagnosis not present

## 2017-11-09 DIAGNOSIS — Z85828 Personal history of other malignant neoplasm of skin: Secondary | ICD-10-CM | POA: Diagnosis not present

## 2017-11-09 DIAGNOSIS — D492 Neoplasm of unspecified behavior of bone, soft tissue, and skin: Secondary | ICD-10-CM | POA: Diagnosis not present

## 2017-11-23 ENCOUNTER — Telehealth: Payer: Self-pay | Admitting: Family Medicine

## 2017-11-23 NOTE — Telephone Encounter (Signed)
Pt is requesting to get the new shingles vaccine. Pt also plans to contact insurance to verify they cover the vaccine in the office. Please advise. Thanks TNP

## 2017-11-23 NOTE — Telephone Encounter (Signed)
Spoke with patient and she is going to check with insurance and then get back for an appointment with Dr Darnell Level

## 2017-11-24 NOTE — Telephone Encounter (Signed)
Pt called back and says that her insurance does pay 100% for shingles vaccine  Ref# [id number]  [facility]

## 2017-12-03 ENCOUNTER — Ambulatory Visit: Payer: Self-pay | Admitting: Family Medicine

## 2017-12-08 ENCOUNTER — Ambulatory Visit
Admission: RE | Admit: 2017-12-08 | Discharge: 2017-12-08 | Disposition: A | Discharge status: Home / Self Care | Payer: BLUE CROSS/BLUE SHIELD | Source: Ambulatory Visit | Attending: Family Medicine | Admitting: Family Medicine

## 2017-12-08 DIAGNOSIS — Z1231 Encounter for screening mammogram for malignant neoplasm of breast: Secondary | ICD-10-CM | POA: Diagnosis not present

## 2017-12-31 ENCOUNTER — Encounter: Payer: Self-pay | Admitting: Family Medicine

## 2018-01-04 ENCOUNTER — Ambulatory Visit: Payer: Self-pay | Admitting: Family Medicine

## 2018-01-05 ENCOUNTER — Encounter: Payer: Self-pay | Admitting: Family Medicine

## 2018-01-05 ENCOUNTER — Ambulatory Visit: Payer: BLUE CROSS/BLUE SHIELD | Admitting: Family Medicine

## 2018-01-05 VITALS — BP 126/74 | Temp 98.2°F | Resp 16 | Ht 63.0 in | Wt 209.0 lb

## 2018-01-05 DIAGNOSIS — Z6837 Body mass index (BMI) 37.0-37.9, adult: Secondary | ICD-10-CM

## 2018-01-05 DIAGNOSIS — E785 Hyperlipidemia, unspecified: Secondary | ICD-10-CM

## 2018-01-05 DIAGNOSIS — E6609 Other obesity due to excess calories: Secondary | ICD-10-CM | POA: Diagnosis not present

## 2018-01-05 DIAGNOSIS — R7303 Prediabetes: Secondary | ICD-10-CM

## 2018-01-05 DIAGNOSIS — R350 Frequency of micturition: Secondary | ICD-10-CM | POA: Diagnosis not present

## 2018-01-05 LAB — POCT URINALYSIS DIPSTICK
Bilirubin, UA: NEGATIVE
Glucose, UA: NEGATIVE
Ketones, UA: NEGATIVE
Nitrite, UA: POSITIVE
Protein, UA: NEGATIVE
Spec Grav, UA: 1.025 (ref 1.010–1.025)
Urobilinogen, UA: 0.2 E.U./dL
pH, UA: 6.5 (ref 5.0–8.0)

## 2018-01-05 MED ORDER — NITROFURANTOIN MACROCRYSTAL 100 MG PO CAPS: 100.0000 mg | ORAL_CAPSULE | Freq: Two times a day (BID) | ORAL | 0 refills | Status: AC

## 2018-01-05 NOTE — Progress Notes (Signed)
[name] Female    DOB: 08/16/1955   62 y.o.   MRN: [id number] Visit Date: 01/05/2018  Today's Provider: Wilhemena Durie, MD   Chief Complaint  Patient presents with  . Hyperglycemia  . Urinary Tract Infection   Subjective:    HPI    Hyperglycemia, Follow-up:   Lab Results  Component Value Date   HGBA1C 6.3 (H) 09/30/2016   HGBA1C 5.8 (H) 04/01/2016   GLUCOSE 101 (H) 09/30/2016   GLUCOSE 102 (H) 04/01/2016    Last seen for for this 1 year ago.  Management since then includes no changes, just to increase exercise and change diet.  Current symptoms include none and have been stable.  Weight trend: decreasing steadily. Patient has lost over 20lbs since she was last seen.  Prior visit with dietician: no Current diet: well balanced Current exercise: walking  Pertinent Labs:    Component Value Date/Time   CHOL 194 09/30/2016 1000   TRIG 133 09/30/2016 1000   CREATININE 0.71 09/30/2016 1000    Wt Readings from Last 3 Encounters:  01/05/18 209 lb (94.8 kg)  09/03/17 210 lb (95.3 kg)  07/14/17 209 lb (94.8 kg)   UTI Patient also mentions that she has urinary frequency. She denies hematuria. Denies discharge or urine odor. She says that it is slightly uncomfortable when she urinates. She has had symptoms for 3 days.     No Known Allergies   Current Outpatient Medications:  .  Biotin 10 MG CAPS, Take by mouth daily., Disp: , Rfl:  .  fluticasone (FLONASE) 50 MCG/ACT nasal spray, Place 2 sprays into both nostrils daily., Disp: 48 g, Rfl: 3 .  omeprazole (PRILOSEC) 40 MG capsule, TAKE 1 CAPSULE DAILY, Disp: 90 capsule, Rfl: 0 .  sertraline (ZOLOFT) 100 MG tablet, TAKE 1 TABLET DAILY, Disp: 90 tablet, Rfl: 0  Review of Systems  Constitutional: Negative for activity change, appetite change, chills, diaphoresis, fatigue, fever and unexpected weight change.  Eyes: Negative.   Respiratory: Negative.   Cardiovascular: Negative.     Gastrointestinal: Negative.   Endocrine: Negative.   Genitourinary: Positive for frequency and urgency. Negative for difficulty urinating, flank pain, hematuria, vaginal bleeding, vaginal discharge and vaginal pain.  Allergic/Immunologic: Negative.   Psychiatric/Behavioral: Negative.     Social History   Tobacco Use  . Smoking status: Former Research scientist (life sciences)  . Smokeless tobacco: Never Used  . Tobacco comment: smoked for about 2 years in high school  Substance Use Topics  . Alcohol use: Yes    Alcohol/week: 3.0 - 3.6 oz    Types: 5 - 6 Standard drinks or equivalent per week   Objective:   BP 126/74 (BP Location: Left Arm, Patient Position: Sitting, Cuff Size: Normal)   Temp 98.2 F (36.8 C)   Resp 16   Ht 5\' 3"  (1.6 m)   Wt 209 lb (94.8 kg)   LMP 04/06/2008   SpO2 96%   BMI 37.02 kg/m  Vitals:   01/05/18 1615  BP: 126/74  Resp: 16  Temp: 98.2 F (36.8 C)  SpO2: 96%  Weight: 209 lb (94.8 kg)  Height: 5\' 3"  (1.6 m)     Physical Exam  Constitutional: She is oriented to person, place, and time. She appears well-developed and well-nourished.  HENT:  Head: Normocephalic and atraumatic.  Right Ear: External ear normal.  Left Ear: External ear normal.  Nose: Nose normal.  Eyes: Conjunctivae are normal. No scleral icterus.  Neck:  No thyromegaly present.  Cardiovascular: Normal rate, regular rhythm and normal heart sounds.  Pulmonary/Chest: Effort normal and breath sounds normal.  Abdominal: Soft.  No CVAT.  Musculoskeletal: She exhibits no edema.  Neurological: She is alert and oriented to person, place, and time.  Skin: Skin is warm and dry.  Psychiatric: She has a normal mood and affect. Her behavior is normal. Judgment and thought content normal.        Assessment & Plan:     1. Borderline diabetes  - CBC with Differential/Platelet - Hemoglobin A1c  2. Hyperlipidemia, unspecified hyperlipidemia type  - Comprehensive metabolic panel - Lipid panel - TSH  3.  Urinary frequency  - POCT urinalysis dipstick - Urine Culture - nitrofurantoin (MACRODANTIN) 100 MG capsule; Take 1 capsule (100 mg total) by mouth 2 (two) times daily for 3 days.  Dispense: 6 capsule; Refill: 0 4.Obesity Pt has lost 30 lbs over past year.    I have done the exam and reviewed the chart and it is accurate to the best of my knowledge. Development worker, community has been used and  any errors in dictation or transcription are unintentional. Miguel Aschoff M.D. [place], MD  [facility]

## 2018-01-06 DIAGNOSIS — R7303 Prediabetes: Secondary | ICD-10-CM | POA: Diagnosis not present

## 2018-01-06 DIAGNOSIS — E785 Hyperlipidemia, unspecified: Secondary | ICD-10-CM | POA: Diagnosis not present

## 2018-01-07 DIAGNOSIS — E669 Obesity, unspecified: Secondary | ICD-10-CM | POA: Insufficient documentation

## 2018-01-07 LAB — CBC WITH DIFFERENTIAL/PLATELET
Basophils Absolute: 0 10*3/uL (ref 0.0–0.2)
Basos: 1 %
EOS (ABSOLUTE): 0.1 10*3/uL (ref 0.0–0.4)
Eos: 1 %
Hematocrit: 38.8 % (ref 34.0–46.6)
Hemoglobin: 12.9 g/dL (ref 11.1–15.9)
Immature Grans (Abs): 0 10*3/uL (ref 0.0–0.1)
Immature Granulocytes: 0 %
Lymphocytes Absolute: 1.7 10*3/uL (ref 0.7–3.1)
Lymphs: 27 %
MCH: 28.1 pg (ref 26.6–33.0)
MCHC: 33.2 g/dL (ref 31.5–35.7)
MCV: 85 fL (ref 79–97)
Monocytes Absolute: 0.4 10*3/uL (ref 0.1–0.9)
Monocytes: 7 %
Neutrophils Absolute: 4.1 10*3/uL (ref 1.4–7.0)
Neutrophils: 64 %
Platelets: 210 10*3/uL (ref 150–450)
RBC: 4.59 x10E6/uL (ref 3.77–5.28)
RDW: 13.8 % (ref 12.3–15.4)
WBC: 6.5 10*3/uL (ref 3.4–10.8)

## 2018-01-07 LAB — COMPREHENSIVE METABOLIC PANEL
ALT: 13 IU/L (ref 0–32)
AST: 15 IU/L (ref 0–40)
Albumin/Globulin Ratio: 2 (ref 1.2–2.2)
Albumin: 4.3 g/dL (ref 3.5–5.5)
Alkaline Phosphatase: 70 IU/L (ref 39–117)
BUN/Creatinine Ratio: 19 (ref 9–23)
BUN: 14 mg/dL (ref 6–24)
Bilirubin Total: 0.5 mg/dL (ref 0.0–1.2)
CO2: 21 mmol/L (ref 20–29)
Calcium: 9.1 mg/dL (ref 8.7–10.2)
Chloride: 106 mmol/L (ref 96–106)
Creatinine, Ser: 0.74 mg/dL (ref 0.57–1.00)
GFR calc Af Amer: 103 mL/min/{1.73_m2} (ref >59)
GFR calc non Af Amer: 89 mL/min/{1.73_m2} (ref >59)
Globulin, Total: 2.2 g/dL (ref 1.5–4.5)
Glucose: 92 mg/dL (ref 65–99)
Potassium: 4.3 mmol/L (ref 3.5–5.2)
Sodium: 141 mmol/L (ref 134–144)
Total Protein: 6.5 g/dL (ref 6.0–8.5)

## 2018-01-07 LAB — LIPID PANEL
Chol/HDL Ratio: 3.8 ratio (ref 0.0–4.4)
Cholesterol, Total: 178 mg/dL (ref 100–199)
HDL: 47 mg/dL (ref >39)
LDL Calculated: 112 mg/dL — ABNORMAL HIGH (ref 0–99)
Triglycerides: 94 mg/dL (ref 0–149)
VLDL Cholesterol Cal: 19 mg/dL (ref 5–40)

## 2018-01-07 LAB — TSH: TSH: 1.41 u[IU]/mL (ref 0.450–4.500)

## 2018-01-07 LAB — HEMOGLOBIN A1C
Est. average glucose Bld gHb Est-mCnc: 117 mg/dL
Hgb A1c MFr Bld: 5.7 % — ABNORMAL HIGH (ref 4.8–5.6)

## 2018-01-08 ENCOUNTER — Telehealth: Payer: Self-pay | Admitting: Family Medicine

## 2018-01-08 DIAGNOSIS — N3000 Acute cystitis without hematuria: Secondary | ICD-10-CM

## 2018-01-08 LAB — URINE CULTURE

## 2018-01-08 MED ORDER — SULFAMETHOXAZOLE-TRIMETHOPRIM 800-160 MG PO TABS: 1.0000 | ORAL_TABLET | Freq: Two times a day (BID) | ORAL | 0 refills | Status: DC

## 2018-01-08 NOTE — Telephone Encounter (Signed)
Pt stated she saw Dr. Rosanna Randy on 01/05/18 and was given nitrofurantoin (MACRODANTIN) 100 MG capsule. Pt stated that she has completed the medication and her symptoms haven't improved. Pt is requesting another Rx be sent to [facility]. Pt is requesting another provider send in the Rx since Dr. Rosanna Randy is out of the office today and she is leaving to go on a business trip Monday 01/11/18. Please advise. Thanks TNP

## 2018-01-08 NOTE — Telephone Encounter (Signed)
Please Review

## 2018-01-08 NOTE — Telephone Encounter (Signed)
Patient advised as below.  

## 2018-01-08 NOTE — Telephone Encounter (Signed)
Culture does show resistance to macrobid. Will send in bactrim.

## 2018-01-12 ENCOUNTER — Telehealth: Payer: Self-pay

## 2018-01-12 NOTE — Telephone Encounter (Signed)
Patient advised as below. Patient reports UTI symptoms have resolved.

## 2018-01-12 NOTE — Telephone Encounter (Signed)
-----   Message from Jerrol Banana., MD sent at 01/12/2018  8:27 AM EDT ----- Labs stable. Have urine symptoms resolved? If not treat with Septra Ds BID for 3 days.

## 2018-01-21 ENCOUNTER — Ambulatory Visit (INDEPENDENT_AMBULATORY_CARE_PROVIDER_SITE_OTHER): Payer: BLUE CROSS/BLUE SHIELD | Admitting: Family Medicine

## 2018-01-21 DIAGNOSIS — Z23 Encounter for immunization: Secondary | ICD-10-CM | POA: Diagnosis not present

## 2018-01-24 NOTE — Progress Notes (Signed)
Vaccine only

## 2018-01-28 ENCOUNTER — Telehealth: Payer: Self-pay | Admitting: Family Medicine

## 2018-01-28 NOTE — Telephone Encounter (Signed)
Please advise 

## 2018-01-28 NOTE — Telephone Encounter (Signed)
Patient states that you have put her on two different antibiotics and she is still having UTI symptoms.  The symptoms did get better and this week has gotten worse again.  She states that she did finish all of her medication.  She is traveling and would like to know if you will send another antibiotic in to [facility] at [address] [place] [address].

## 2018-01-29 ENCOUNTER — Telehealth: Payer: Self-pay | Admitting: Family Medicine

## 2018-01-29 DIAGNOSIS — N3000 Acute cystitis without hematuria: Secondary | ICD-10-CM

## 2018-01-29 MED ORDER — SULFAMETHOXAZOLE-TRIMETHOPRIM 800-160 MG PO TABS: 1.0000 | ORAL_TABLET | Freq: Two times a day (BID) | ORAL | 0 refills | Status: DC

## 2018-01-29 NOTE — Telephone Encounter (Signed)
Patient calling stating she had called yesterday about getting some medication due to having a UTI and pt states she is traveling and went to the [facility] to see if anything had been called in and was told nothing had been called in. Pt calling to see if something can be called in today for this. Please call to advise @ [phone] Thanks CC

## 2018-01-29 NOTE — Telephone Encounter (Signed)
Patient is a patient of Dr. Marlan Palau and she is requesting another round of abx to be sent into the pharmacy. She is currently out of town. Please review. Thanks!

## 2018-01-29 NOTE — Telephone Encounter (Signed)
done

## 2018-02-01 NOTE — Telephone Encounter (Signed)
Check with pt now--should not have ongoing symptoms from simple UTI--may need to recheck of refer.

## 2018-02-01 NOTE — Telephone Encounter (Signed)
On call doctor gave her a refill on the antibiotic.  She has been instructed that if this does not clear it she will need to be seen again.

## 2018-03-14 ENCOUNTER — Other Ambulatory Visit: Payer: Self-pay | Admitting: Family Medicine

## 2018-03-14 DIAGNOSIS — F419 Anxiety disorder, unspecified: Secondary | ICD-10-CM

## 2018-04-01 ENCOUNTER — Ambulatory Visit: Payer: Self-pay | Admitting: Family Medicine

## 2018-05-10 ENCOUNTER — Ambulatory Visit: Payer: BLUE CROSS/BLUE SHIELD | Admitting: Family Medicine

## 2018-05-10 ENCOUNTER — Encounter: Payer: Self-pay | Admitting: Family Medicine

## 2018-05-10 VITALS — BP 107/75 | HR 83 | Temp 98.9°F | Wt 210.2 lb

## 2018-05-10 DIAGNOSIS — J0140 Acute pansinusitis, unspecified: Secondary | ICD-10-CM | POA: Diagnosis not present

## 2018-05-10 MED ORDER — AMOXICILLIN-POT CLAVULANATE 875-125 MG PO TABS: 1.0000 | ORAL_TABLET | Freq: Two times a day (BID) | ORAL | 0 refills | Status: AC

## 2018-05-10 MED ORDER — BENZONATATE 100 MG PO CAPS: 100.0000 mg | ORAL_CAPSULE | Freq: Three times a day (TID) | ORAL | 0 refills | Status: DC | PRN

## 2018-05-10 NOTE — Progress Notes (Signed)
[name] Female    DOB: 09-Feb-1956   62 y.o.   MRN: [id number] Visit Date: 05/10/2018  Today's Provider: Lavon Paganini, MD   Chief Complaint  Patient presents with  . URI   Subjective:    I, Tiburcio Pea, CMA, am acting as a scribe for Lavon Paganini, MD.   URI   This is a new problem. Episode onset: approximately 1 week ago. The problem has been gradually worsening. There has been no fever. Associated symptoms include congestion, coughing, ear pain, sinus pain and sneezing. Associated symptoms comments: Postnasal drainage . Treatments tried: Mucinex D. The treatment provided mild relief.       No Known Allergies   Current Outpatient Medications:  .  Biotin 10 MG CAPS, Take by mouth daily., Disp: , Rfl:  .  fluticasone (FLONASE) 50 MCG/ACT nasal spray, Place 2 sprays into both nostrils daily., Disp: 48 g, Rfl: 3 .  omeprazole (PRILOSEC) 40 MG capsule, TAKE 1 CAPSULE DAILY, Disp: 90 capsule, Rfl: 0 .  sertraline (ZOLOFT) 100 MG tablet, TAKE 1 TABLET DAILY, WILL NEED TO BE SEEN FOR MORE, Disp: 90 tablet, Rfl: 4  Review of Systems  Constitutional: Negative.   HENT: Positive for congestion, ear pain, sinus pain and sneezing.   Respiratory: Positive for cough.   Cardiovascular: Negative.   Gastrointestinal: Negative.   Genitourinary: Negative.   Musculoskeletal: Negative.     Social History   Tobacco Use  . Smoking status: Former Research scientist (life sciences)  . Smokeless tobacco: Never Used  . Tobacco comment: smoked for about 2 years in high school  Substance Use Topics  . Alcohol use: Yes    Alcohol/week: 5.0 - 6.0 standard drinks    Types: 5 - 6 Standard drinks or equivalent per week   Objective:   BP 107/75 (BP Location: Left Arm, Patient Position: Sitting, Cuff Size: Large)   Pulse 83   Temp 98.9 F (37.2 C) (Oral)   Wt 210 lb 3.2 oz (95.3 kg)   LMP 04/06/2008   SpO2 96%   BMI 37.24 kg/m  Vitals:   05/10/18 0946  BP: 107/75  Pulse: 83  Temp:  98.9 F (37.2 C)  TempSrc: Oral  SpO2: 96%  Weight: 210 lb 3.2 oz (95.3 kg)     Physical Exam  Constitutional: She is oriented to person, place, and time. She appears well-developed and well-nourished. No distress.  HENT:  Head: Normocephalic and atraumatic.  Right Ear: Tympanic membrane, external ear and ear canal normal.  Left Ear: Tympanic membrane, external ear and ear canal normal.  Nose: Mucosal edema present. Right sinus exhibits maxillary sinus tenderness and frontal sinus tenderness. Left sinus exhibits maxillary sinus tenderness and frontal sinus tenderness.  Mouth/Throat: Uvula is midline, oropharynx is clear and moist and mucous membranes are normal. No oropharyngeal exudate.  Eyes: Pupils are equal, round, and reactive to light. Conjunctivae and EOM are normal. Right eye exhibits no discharge. Left eye exhibits no discharge. No scleral icterus.  Neck: Neck supple. No thyromegaly present.  Cardiovascular: Normal rate, regular rhythm, normal heart sounds and intact distal pulses.  No murmur heard. Pulmonary/Chest: Effort normal and breath sounds normal. No respiratory distress. She has no wheezes. She has no rales.  Musculoskeletal: She exhibits no edema.  Lymphadenopathy:    She has no cervical adenopathy.  Neurological: She is alert and oriented to person, place, and time.  Skin: Skin is warm and dry. Capillary refill takes less than 2 seconds. No rash  noted.  Psychiatric: She has a normal mood and affect. Her behavior is normal.  Vitals reviewed.       Assessment & Plan:   1. Acute non-recurrent pansinusitis - symptoms and exam c/w sinusitis   - no evidence of AOM, CAP, strep pharyngitis, or other infection - given duration of symptoms, suspect bacterial etiology - will treat with Augmentin x7d - tessalon prn for cough - discussed symptomatic management (flonase, decongestants, etc), natural course, and return precautions    Meds ordered this encounter    Medications  . benzonatate (TESSALON) 100 MG capsule    Sig: Take 1-2 capsules (100-200 mg total) by mouth 3 (three) times daily as needed for cough.    Dispense:  60 capsule    Refill:  0  . amoxicillin-clavulanate (AUGMENTIN) 875-125 MG tablet    Sig: Take 1 tablet by mouth 2 (two) times daily for 7 days.    Dispense:  14 tablet    Refill:  0     Return if symptoms worsen or fail to improve.   The entirety of the information documented in the History of Present Illness, Review of Systems and Physical Exam were personally obtained by me. Portions of this information were initially documented by Tiburcio Pea, CMA and reviewed by me for thoroughness and accuracy.    Virginia Crews, MD, MPH [facility] 05/10/2018 10:02 AM

## 2018-05-10 NOTE — Patient Instructions (Signed)

## 2018-05-24 ENCOUNTER — Ambulatory Visit (INDEPENDENT_AMBULATORY_CARE_PROVIDER_SITE_OTHER): Payer: BLUE CROSS/BLUE SHIELD | Admitting: Family Medicine

## 2018-05-24 ENCOUNTER — Encounter: Payer: Self-pay | Admitting: Family Medicine

## 2018-05-24 VITALS — BP 122/60 | HR 70 | Temp 98.2°F | Resp 16

## 2018-05-24 DIAGNOSIS — Z23 Encounter for immunization: Secondary | ICD-10-CM

## 2018-05-24 DIAGNOSIS — E6609 Other obesity due to excess calories: Secondary | ICD-10-CM | POA: Diagnosis not present

## 2018-05-24 DIAGNOSIS — K219 Gastro-esophageal reflux disease without esophagitis: Secondary | ICD-10-CM | POA: Diagnosis not present

## 2018-05-24 DIAGNOSIS — F419 Anxiety disorder, unspecified: Secondary | ICD-10-CM | POA: Diagnosis not present

## 2018-05-24 DIAGNOSIS — Z6837 Body mass index (BMI) 37.0-37.9, adult: Secondary | ICD-10-CM

## 2018-05-24 MED ORDER — OMEPRAZOLE 20 MG PO CPDR: 20.0000 mg | DELAYED_RELEASE_CAPSULE | Freq: Every day | ORAL | 3 refills | Status: DC

## 2018-05-24 MED ORDER — SERTRALINE HCL 100 MG PO TABS: 100.0000 mg | ORAL_TABLET | Freq: Every day | ORAL | 3 refills | Status: DC

## 2018-05-24 NOTE — Progress Notes (Signed)
[name] Female    DOB: [birth year]   62 y.o.   MRN: [id number] Visit Date: 05/24/2018  Today's Provider: Wilhemena Durie, MD   Chief Complaint  Patient presents with  . Follow-up   Subjective:    HPI  Patient comes in today for a follow up. She was last seen in the office 6 months ago. No medications were changed since last visit. However, patient would like omeprazole changed from 40mg  daily to 20mg  daily. She is also due for 2nd shingles vaccine today.     No Known Allergies   Current Outpatient Medications:  .  benzonatate (TESSALON) 100 MG capsule, Take 1-2 capsules (100-200 mg total) by mouth 3 (three) times daily as needed for cough., Disp: 60 capsule, Rfl: 0 .  Biotin 10 MG CAPS, Take by mouth daily., Disp: , Rfl:  .  fluticasone (FLONASE) 50 MCG/ACT nasal spray, Place 2 sprays into both nostrils daily., Disp: 48 g, Rfl: 3 .  omeprazole (PRILOSEC) 40 MG capsule, TAKE 1 CAPSULE DAILY, Disp: 90 capsule, Rfl: 0 .  sertraline (ZOLOFT) 100 MG tablet, TAKE 1 TABLET DAILY, WILL NEED TO BE SEEN FOR MORE, Disp: 90 tablet, Rfl: 4  Review of Systems  Constitutional: Negative.   Eyes: Negative.   Respiratory: Negative.   Cardiovascular: Negative.   Gastrointestinal: Negative.   Endocrine: Negative.   Musculoskeletal: Negative.   Allergic/Immunologic: Negative.   Neurological: Negative.   Psychiatric/Behavioral: Negative.     Social History   Tobacco Use  . Smoking status: Former Research scientist (life sciences)  . Smokeless tobacco: Never Used  . Tobacco comment: smoked for about 2 years in high school  Substance Use Topics  . Alcohol use: Yes    Alcohol/week: 5.0 - 6.0 standard drinks    Types: 5 - 6 Standard drinks or equivalent per week   Objective:   BP 122/60 (BP Location: Left Arm, Patient Position: Sitting, Cuff Size: Normal)   Pulse 70   Temp 98.2 F (36.8 C)   Resp 16   LMP 04/06/2008   SpO2 96%  Vitals:   05/24/18 0809  BP: 122/60  Pulse: 70    Resp: 16  Temp: 98.2 F (36.8 C)  SpO2: 96%     Physical Exam  Constitutional: She is oriented to person, place, and time. She appears well-developed and well-nourished.  HENT:  Head: Normocephalic and atraumatic.  Eyes: Conjunctivae are normal. No scleral icterus.  Neck: No thyromegaly present.  Cardiovascular: Normal rate, regular rhythm and normal heart sounds.  Pulmonary/Chest: Effort normal and breath sounds normal.  Abdominal: Soft.  Musculoskeletal: She exhibits no edema.  Neurological: She is alert and oriented to person, place, and time.  Skin: Skin is warm and dry.  Psychiatric: She has a normal mood and affect. Her behavior is normal. Judgment and thought content normal.        Assessment & Plan:     1. Gastroesophageal reflux disease, esophagitis presence not specified Patient feels well. - omeprazole (PRILOSEC) 20 MG capsule; Take 1 capsule (20 mg total) by mouth daily.  Dispense: 90 capsule; Refill: 3  2. Anxiety We will discuss possibly weaning off this medicine in 2020.  Return to clinic 6 months - sertraline (ZOLOFT) 100 MG tablet; Take 1 tablet (100 mg total) by mouth daily.  Dispense: 90 tablet; Refill: 3  3. Need for zoster vaccination  - Varicella-zoster vaccine IM (Shingrix) 4.Obesity Patient continues to work on lifestyle.  I have done the exam and reviewed the above chart and it is accurate to the best of my knowledge. Development worker, community has been used in this note in any air is in the dictation or transcription are unintentional.  Wilhemena Durie, MD  [facility]

## 2018-06-24 ENCOUNTER — Emergency Department
Admission: EM | Admit: 2018-06-24 | Discharge: 2018-06-25 | Disposition: A | Discharge status: Home / Self Care | Payer: BLUE CROSS/BLUE SHIELD | Attending: Emergency Medicine | Admitting: Emergency Medicine

## 2018-06-24 ENCOUNTER — Emergency Department: Payer: BLUE CROSS/BLUE SHIELD

## 2018-06-24 DIAGNOSIS — R7303 Prediabetes: Secondary | ICD-10-CM | POA: Insufficient documentation

## 2018-06-24 DIAGNOSIS — Z87891 Personal history of nicotine dependence: Secondary | ICD-10-CM | POA: Diagnosis not present

## 2018-06-24 DIAGNOSIS — Z79899 Other long term (current) drug therapy: Secondary | ICD-10-CM | POA: Insufficient documentation

## 2018-06-24 DIAGNOSIS — R05 Cough: Secondary | ICD-10-CM | POA: Diagnosis not present

## 2018-06-24 DIAGNOSIS — J40 Bronchitis, not specified as acute or chronic: Secondary | ICD-10-CM

## 2018-06-24 DIAGNOSIS — R0602 Shortness of breath: Secondary | ICD-10-CM | POA: Diagnosis not present

## 2018-06-24 DIAGNOSIS — J189 Pneumonia, unspecified organism: Secondary | ICD-10-CM

## 2018-06-24 LAB — COMPREHENSIVE METABOLIC PANEL
ALT: 18 U/L (ref 0–44)
AST: 17 U/L (ref 15–41)
Albumin: 4.3 g/dL (ref 3.5–5.0)
Alkaline Phosphatase: 63 U/L (ref 38–126)
Anion gap: 10 (ref 5–15)
BUN: 11 mg/dL (ref 6–20)
CO2: 22 mmol/L (ref 22–32)
Calcium: 8.8 mg/dL — ABNORMAL LOW (ref 8.9–10.3)
Chloride: 106 mmol/L (ref 98–111)
Creatinine, Ser: 0.59 mg/dL (ref 0.44–1.00)
GFR calc Af Amer: >60 mL/min (ref >60)
GFR calc non Af Amer: >60 mL/min (ref >60)
Glucose, Bld: 102 mg/dL — ABNORMAL HIGH (ref 70–99)
Potassium: 3.7 mmol/L (ref 3.5–5.1)
Sodium: 138 mmol/L (ref 135–145)
Total Bilirubin: 0.4 mg/dL (ref 0.3–1.2)
Total Protein: 7.5 g/dL (ref 6.5–8.1)

## 2018-06-24 LAB — CBC
HCT: 40.4 % (ref 36.0–46.0)
Hemoglobin: 13.4 g/dL (ref 12.0–15.0)
MCH: 28.4 pg (ref 26.0–34.0)
MCHC: 33.2 g/dL (ref 30.0–36.0)
MCV: 85.6 fL (ref 80.0–100.0)
Platelets: 186 10*3/uL (ref 150–400)
RBC: 4.72 MIL/uL (ref 3.87–5.11)
RDW: 12.3 % (ref 11.5–15.5)
WBC: 10.2 10*3/uL (ref 4.0–10.5)
nRBC: 0 % (ref 0.0–0.2)

## 2018-06-24 LAB — TROPONIN I: Troponin I: <0.03 ng/mL (ref <0.03)

## 2018-06-24 MED ORDER — ALBUTEROL SULFATE 108 (90 BASE) MCG/ACT IN AEPB: 1.0000 | INHALATION_SPRAY | RESPIRATORY_TRACT | 0 refills | Status: DC | PRN

## 2018-06-24 MED ORDER — KETOROLAC TROMETHAMINE 30 MG/ML IJ SOLN
15.0000 mg | Freq: Once | INTRAMUSCULAR | Status: AC
  Administered 2018-06-24: 15 mg via INTRAVENOUS
  Filled 2018-06-24: qty 1

## 2018-06-24 MED ORDER — DEXAMETHASONE SODIUM PHOSPHATE 10 MG/ML IJ SOLN
10.0000 mg | Freq: Once | INTRAMUSCULAR | Status: AC
  Administered 2018-06-24: 10 mg via INTRAVENOUS
  Filled 2018-06-24: qty 1

## 2018-06-24 MED ORDER — LIDOCAINE HCL (PF) 1 % IJ SOLN
5.0000 mL | Freq: Once | INTRAMUSCULAR | Status: AC
  Administered 2018-06-25: 5 mL via INTRADERMAL
  Filled 2018-06-24: qty 5

## 2018-06-24 MED ORDER — SODIUM CHLORIDE 0.9 % IV SOLN
1.0000 g | Freq: Once | INTRAVENOUS | Status: AC
  Administered 2018-06-25: 1 g via INTRAVENOUS
  Filled 2018-06-24: qty 10

## 2018-06-24 MED ORDER — IPRATROPIUM-ALBUTEROL 0.5-2.5 (3) MG/3ML IN SOLN
3.0000 mL | Freq: Once | RESPIRATORY_TRACT | Status: AC
  Administered 2018-06-25: 3 mL via RESPIRATORY_TRACT
  Filled 2018-06-24: qty 3

## 2018-06-24 MED ORDER — OXYMETAZOLINE HCL 0.05 % NA SOLN
1.0000 | Freq: Once | NASAL | Status: AC
  Administered 2018-06-25: 1 via NASAL
  Filled 2018-06-24: qty 15

## 2018-06-24 MED ORDER — AZITHROMYCIN 500 MG PO TABS
500.0000 mg | ORAL_TABLET | Freq: Once | ORAL | Status: AC
  Administered 2018-06-25: 500 mg via ORAL
  Filled 2018-06-24: qty 1

## 2018-06-24 MED ORDER — SODIUM CHLORIDE 0.9 % IV BOLUS
1000.0000 mL | Freq: Once | INTRAVENOUS | Status: AC
  Administered 2018-06-25: 1000 mL via INTRAVENOUS

## 2018-06-24 MED ORDER — AZITHROMYCIN 250 MG PO TABS: ORAL_TABLET | ORAL | 0 refills | Status: DC

## 2018-06-24 MED ORDER — AMOXICILLIN 500 MG PO TABS: 1000.0000 mg | ORAL_TABLET | Freq: Three times a day (TID) | ORAL | 0 refills | Status: AC

## 2018-06-24 MED ORDER — ACETAMINOPHEN 500 MG PO TABS
1000.0000 mg | ORAL_TABLET | Freq: Once | ORAL | Status: AC
  Administered 2018-06-25: 1000 mg via ORAL
  Filled 2018-06-24: qty 2

## 2018-06-24 MED ORDER — PSEUDOEPHEDRINE HCL 30 MG PO TABS
30.0000 mg | ORAL_TABLET | Freq: Once | ORAL | Status: AC
  Administered 2018-06-25: 30 mg via ORAL
  Filled 2018-06-24: qty 1

## 2018-06-24 NOTE — ED Notes (Signed)
Pt reports she was sent from [place] for eval of sob.  Sx for 5 days.  Recent sinus infection.  Nonsmoker.  Pt coughing up brown phlegm.  No chest pain.  Pt alert  Speech clear.  nsr on monitor.  Family with pt.

## 2018-06-24 NOTE — ED Triage Notes (Signed)
Patient referred by [facility] clinic for cough and oxygen saturation of 90% on RA. Patient's oxygen saturation on RA at arrival to this ED is 94%.

## 2018-06-24 NOTE — ED Provider Notes (Signed)
[facility] Emergency Department Provider Note  ____________________________________________   First MD Initiated Contact with Patient 06/24/18 2256     (approximate)  I have reviewed the triage vital signs and the nursing notes.   HISTORY  Chief Complaint Cough   HPI Evelyn Hayes is a 62 y.o. female who was sent to the emergency department from [facility] for cough and hypoxia.  The patient reports several days of insidious onset slowly progressive now moderate severity slightly productive cough and shortness of breath.  Her symptoms seem to be somewhat worse with exertion and somewhat improved with rest.  Her symptoms began about 5 days ago.  She does report having a recent "sinus infection".  She is now coughing up brown sputum.  She has no history of asthma or COPD.  She does not smoke.  When she arrived in clinic today she was saturating 90% on room air so they transferred her to the emergency department.  Her symptoms were gradual in onset are now moderate severity.   Past Medical History:  Diagnosis Date  . Anxiety   . 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 GERD (gastroesophageal reflux disease)   . Knee cartilage, torn, right 2003  . 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 Prediabetes     Patient Active Problem List   Diagnosis Date Noted  . Adiposity 01/07/2018  . 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 Adult-onset obesity 07/10/2015  . Allergic rhinitis 07/10/2015  . Anxiety 07/10/2015  . Abnormal liver enzymes 07/10/2015  . Acid reflux 07/10/2015  . Hypercholesteremia 07/10/2015  . Borderline diabetes 07/10/2015  . Apnea, sleep 07/10/2015  . Avitaminosis D 07/10/2015    Past Surgical History:  Procedure Laterality Date  . 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 COLPOSCOPY  1998  . hypo thermal ablation  06/02/05  . KNEE SURGERY  07/2004  . TONSILLECTOMY AND ADENOIDECTOMY    . WISDOM  TOOTH EXTRACTION      Prior to Admission medications   Medication Sig Start Date End Date Taking? Authorizing Provider  Albuterol Sulfate (PROAIR RESPICLICK) 712 (90 Base) MCG/ACT AEPB Inhale 1 puff into the lungs every 4 (four) hours as needed (wheezing or cough). 06/24/18   Darel Hong, MD  amoxicillin (AMOXIL) 500 MG tablet Take 2 tablets (1,000 mg total) by mouth 3 (three) times daily for 7 days. 06/24/18 07/01/18  Darel Hong, MD  azithromycin (ZITHROMAX Z-PAK) 250 MG tablet Take 2 tablets (500 mg) on  Day 1,  followed by 1 tablet (250 mg) once daily on Days 2 through 5. 06/24/18   Darel Hong, MD  benzonatate (TESSALON) 100 MG capsule Take 1-2 capsules (100-200 mg total) by mouth 3 (three) times daily as needed for cough. 05/10/18   Virginia Crews, MD  Biotin 10 MG CAPS Take by mouth daily.    [provider]  fluticasone (FLONASE) 50 MCG/ACT nasal spray Place 2 sprays into both nostrils daily. 09/17/15   Jerrol Banana., MD  omeprazole (PRILOSEC) 20 MG capsule Take 1 capsule (20 mg total) by mouth daily. 05/24/18   Jerrol Banana., MD  sertraline (ZOLOFT) 100 MG tablet Take 1 tablet (100 mg total) by mouth daily. 05/24/18   Jerrol Banana., MD    Allergies Patient has no known allergies.  Family History  Problem Relation Age of Onset  . Hypertension Mother   . Heart disease Father   .  Breast cancer Other   . Osteoporosis Maternal Grandmother   . Ovarian cancer Cousin 57    Social History Social History   Tobacco Use  . Smoking status: Former Research scientist (life sciences)  . Smokeless tobacco: Never Used  . Tobacco comment: smoked for about 2 years in high school  Substance Use Topics  . Alcohol use: Yes    Alcohol/week: 5.0 - 6.0 standard drinks    Types: 5 - 6 Standard drinks or equivalent per week  . Drug use: No    Review of Systems Constitutional: No fever/chills Eyes: No visual changes. ENT: Positive for sinus congestion Cardiovascular:  Positive for chest pain. Respiratory: Positive for shortness of breath. Gastrointestinal: No abdominal pain.  No nausea, no vomiting.  No diarrhea.  No constipation. Genitourinary: Negative for dysuria. Musculoskeletal: Negative for back pain. Skin: Negative for rash. Neurological: Negative for headaches, focal weakness or numbness.   ____________________________________________   PHYSICAL EXAM:  VITAL SIGNS: ED Triage Vitals [06/24/18 1912]  Enc Vitals Group     BP (!) 145/67     Pulse Rate 86     Resp 18     Temp 99.3 F (37.4 C)     Temp Source Oral     SpO2 94 %     Weight 209 lb 7 oz (95 kg)     Height      Head Circumference      Peak Flow      Pain Score 0     Pain Loc      Pain Edu?      Excl. in [place]?     Constitutional: Alert and oriented x4 speaks in full complete sentences although does appear somewhat winded by the end.  Somewhat hoarse voice Eyes: PERRL EOMI. Head: Atraumatic. Nose: No congestion/rhinnorhea. Mouth/Throat: No trismus uvula midline slight erythema but no exudate. Neck: No stridor.   Cardiovascular: Normal rate, regular rhythm. Grossly normal heart sounds.  Good peripheral circulation. Respiratory: Slightly increased respiratory effort although not using accessory muscles.  She has crackles throughout along with some expiratory wheezing Gastrointestinal: Soft nontender Musculoskeletal: No lower extremity edema   Neurologic:  Normal speech and language. No gross focal neurologic deficits are appreciated. Skin:  Skin is warm, dry and intact. No rash noted. Psychiatric: Mood and affect are normal. Speech and behavior are normal.    ____________________________________________   DIFFERENTIAL includes but not limited to  Pneumonia, bronchitis, influenza, pulmonary embolism ____________________________________________   LABS (all labs ordered are listed, but only abnormal results are displayed)  Labs Reviewed  COMPREHENSIVE METABOLIC  PANEL - Abnormal; Notable for the following components:      Result Value   Glucose, Bld 102 (*)    Calcium 8.8 (*)    All other components within normal limits  CBC  TROPONIN I  INFLUENZA PANEL BY PCR (TYPE A & B)    Lab work reviewed by me shows she is negative for influenza no signs of acute ischemia __________________________________________  EKG  ED ECG REPORT I, Darel Hong, the attending physician, personally viewed and interpreted this ECG.  Date: 06/24/2018 EKG Time:  Rate: 89 Rhythm: normal sinus rhythm QRS Axis: normal Intervals: normal ST/T Wave abnormalities: normal Narrative Interpretation: no evidence of acute ischemia  ____________________________________________  RADIOLOGY  Chest x-ray reviewed by me with no acute disease ____________________________________________   PROCEDURES  Procedure(s) performed: no  Procedures  Critical Care performed: no  ____________________________________________   INITIAL IMPRESSION / ASSESSMENT AND PLAN / ED COURSE  Pertinent labs & imaging results that were available during my care of the patient were reviewed by me and considered in my medical decision making (see chart for details).   As part of my medical decision making, I reviewed the following data within the [facility] History obtained from family if available, nursing notes, old chart and ekg, as well as notes from prior ED visits.  The patient comes to the emergency department with 5 days of progressive shortness of breath now with productive sputum along with crackles and wheezing throughout.  She is hypoxic to 90% in clinic on room air although here slightly higher.  She has no history of asthma or COPD.  Given her new onset oxygen requirements I will get labs check influenza and a chest x-ray.  We will also treat her with several duo nebs and treat her symptomatically with Sudafed, Afrin, Tylenol, and Toradol along with a liter of IV  fluids for her likely insensible losses.  The patient feels significantly improved after bronchodilators.  I appreciate that her chest x-ray is negative however she was clearly dehydrated and in the setting of new oxygen requirement and now productive cough I think she has pneumonia not being picked up on the chest x-ray.  I have given her a gram of ceftriaxone and a dose of azithromycin along with dexamethasone for her reactive airway disease component.  She is now able to ambulate without desaturation so we will cover her with amoxicillin and azithromycin for home along with albuterol and refer her back to primary care.  I did offer the patient inpatient admission given her previous hypoxia however she declined stating she felt comfortable going home which I think is entirely reasonable.  Strict return precautions have been given.      ____________________________________________   FINAL CLINICAL IMPRESSION(S) / ED DIAGNOSES  Final diagnoses:  Community acquired pneumonia of left lung, unspecified part of lung  Bronchitis      NEW MEDICATIONS STARTED DURING THIS VISIT:  Discharge Medication List as of 06/25/2018  1:20 AM    START taking these medications   Details  Albuterol Sulfate (PROAIR RESPICLICK) 834 (90 Base) MCG/ACT AEPB Inhale 1 puff into the lungs every 4 (four) hours as needed (wheezing or cough)., Starting Thu 06/24/2018, Print    amoxicillin (AMOXIL) 500 MG tablet Take 2 tablets (1,000 mg total) by mouth 3 (three) times daily for 7 days., Starting Thu 06/24/2018, Until Thu 07/01/2018, Print    azithromycin (ZITHROMAX Z-PAK) 250 MG tablet Take 2 tablets (500 mg) on  Day 1,  followed by 1 tablet (250 mg) once daily on Days 2 through 5., Print         Note:  This document was prepared using Dragon voice recognition software and may include unintentional dictation errors.    Darel Hong, MD 06/28/18 [phone]

## 2018-06-24 NOTE — ED Notes (Signed)
Report off to sherie rn 

## 2018-06-24 NOTE — ED Notes (Signed)
Pt c/o SHOB, esp with coughing; O2 sat recheck--95% on ra; placed pt on O2 at 2l/min via [place] for comfort; protocols added

## 2018-06-25 ENCOUNTER — Ambulatory Visit: Payer: BLUE CROSS/BLUE SHIELD | Admitting: Physician Assistant

## 2018-06-25 ENCOUNTER — Other Ambulatory Visit: Payer: Self-pay

## 2018-06-25 LAB — INFLUENZA PANEL BY PCR (TYPE A & B)
Influenza A By PCR: NEGATIVE
Influenza B By PCR: NEGATIVE

## 2018-06-25 NOTE — ED Notes (Signed)
EDP in with patient 

## 2018-06-25 NOTE — ED Notes (Signed)
Patient alert and oriented x4. Writer reviewed discharge info and precriptions with patient. Patient educated on ABT treatment. Patient states understanding and signed discharge info.

## 2018-06-25 NOTE — Discharge Instructions (Signed)
Please take both of your antibiotics as prescribed and use your inhaler as needed for cough and shortness of breath.  I expect you to be sick for another 48 to 72 hours.  Please make sure everyone at home is washing their hands.  The last thing that will go away will be her cough and it is considered normal for a cough the last 3 to 4 weeks after an illness this severe.  Make sure you remain well-hydrated and follow-up with your primary care physician as needed and return to the emergency department sooner for any concerns.  It was a pleasure to take care of you today, and thank you for coming to our emergency department.  If you have any questions or concerns before leaving please ask the nurse to grab me and I'm more than happy to go through your aftercare instructions again.  If you were prescribed any opioid pain medication today such as Norco, Vicodin, Percocet, morphine, hydrocodone, or oxycodone please make sure you do not drive when you are taking this medication as it can alter your ability to drive safely.  If you have any concerns once you are home that you are not improving or are in fact getting worse before you can make it to your follow-up appointment, please do not hesitate to call 911 and come back for further evaluation.  Darel Hong, MD  Results for orders placed or performed during the hospital encounter of 06/24/18  CBC  Result Value Ref Range   WBC 10.2 4.0 - 10.5 K/uL   RBC 4.72 3.87 - 5.11 MIL/uL   Hemoglobin 13.4 12.0 - 15.0 g/dL   HCT 40.4 36.0 - 46.0 %   MCV 85.6 80.0 - 100.0 fL   MCH 28.4 26.0 - 34.0 pg   MCHC 33.2 30.0 - 36.0 g/dL   RDW 12.3 11.5 - 15.5 %   Platelets 186 150 - 400 K/uL   nRBC 0.0 0.0 - 0.2 %  Comprehensive metabolic panel  Result Value Ref Range   Sodium 138 135 - 145 mmol/L   Potassium 3.7 3.5 - 5.1 mmol/L   Chloride 106 98 - 111 mmol/L   CO2 22 22 - 32 mmol/L   Glucose, Bld 102 (H) 70 - 99 mg/dL   BUN 11 6 - 20 mg/dL   Creatinine, Ser  0.59 0.44 - 1.00 mg/dL   Calcium 8.8 (L) 8.9 - 10.3 mg/dL   Total Protein 7.5 6.5 - 8.1 g/dL   Albumin 4.3 3.5 - 5.0 g/dL   AST 17 15 - 41 U/L   ALT 18 0 - 44 U/L   Alkaline Phosphatase 63 38 - 126 U/L   Total Bilirubin 0.4 0.3 - 1.2 mg/dL   GFR calc non Af Amer >60 >60 mL/min   GFR calc Af Amer >60 >60 mL/min   Anion gap 10 5 - 15  Troponin I - Add-On to previous collection  Result Value Ref Range   Troponin I <0.03 <0.03 ng/mL   Dg Chest 2 View  Result Date: 06/24/2018 CLINICAL DATA:  Cough.  Decreased oxygen saturation. EXAM: CHEST - 2 VIEW COMPARISON:  Chest CT 04/04/2009 FINDINGS: Stable cardiac and mediastinal contours. Left basilar atelectasis. No pleural effusion or pneumothorax. Thoracic spine degenerative changes. IMPRESSION: No acute cardiopulmonary process. Electronically Signed   By: Lovey Newcomer M.D.   On: 06/24/2018 19:54

## 2018-07-07 DIAGNOSIS — T8859XA Other complications of anesthesia, initial encounter: Secondary | ICD-10-CM

## 2018-07-07 HISTORY — DX: Other complications of anesthesia, initial encounter: T88.59XA

## 2018-09-06 ENCOUNTER — Ambulatory Visit: Payer: BLUE CROSS/BLUE SHIELD | Admitting: Obstetrics and Gynecology

## 2018-09-21 NOTE — Progress Notes (Deleted)
63 y.o. G3P3 Married White or Caucasian Not Hispanic or Latino female here for annual exam.      Patient's last menstrual period was 04/06/2008.          Sexually active: {yes no:314532}  The current method of family planning is {contraception:315051}.    Exercising: {yes no:314532}  {types:19826} Smoker:  {YES NO:22349}  Health Maintenance: Pap:  08-27-16 WNL 08-23-15 WNL  NEG HR HPV  History of abnormal Pap:  Yes LEEP 1997 MMG:  12/08/2017 Birads 1 negative Colonoscopy:  2009 normal - due this year BMD:   Never TDaP:  09-30-16  Gardasil: N/A   reports that she has quit smoking. She has never used smokeless tobacco. She reports current alcohol use of about 5.0 - 6.0 standard drinks of alcohol per week. She reports that she does not use drugs.  Past Medical History:  Diagnosis Date  . Anxiety   . 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 GERD (gastroesophageal reflux disease)   . Knee cartilage, torn, right 2003  . 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 Prediabetes     Past Surgical History:  Procedure Laterality Date  . 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 COLPOSCOPY  1998  . hypo thermal ablation  06/02/05  . KNEE SURGERY  07/2004  . TONSILLECTOMY AND ADENOIDECTOMY    . WISDOM TOOTH EXTRACTION      Current Outpatient Medications  Medication Sig Dispense Refill  . Albuterol Sulfate (PROAIR RESPICLICK) 568 (90 Base) MCG/ACT AEPB Inhale 1 puff into the lungs every 4 (four) hours as needed (wheezing or cough). 1 each 0  . azithromycin (ZITHROMAX Z-PAK) 250 MG tablet Take 2 tablets (500 mg) on  Day 1,  followed by 1 tablet (250 mg) once daily on Days 2 through 5. 6 each 0  . benzonatate (TESSALON) 100 MG capsule Take 1-2 capsules (100-200 mg total) by mouth 3 (three) times daily as needed for cough. 60 capsule 0  . Biotin 10 MG CAPS Take by mouth daily.    . fluticasone (FLONASE) 50 MCG/ACT nasal spray Place 2 sprays into both nostrils  daily. 48 g 3  . omeprazole (PRILOSEC) 20 MG capsule Take 1 capsule (20 mg total) by mouth daily. 90 capsule 3  . sertraline (ZOLOFT) 100 MG tablet Take 1 tablet (100 mg total) by mouth daily. 90 tablet 3   No current facility-administered medications for this visit.     Family History  Problem Relation Age of Onset  . Hypertension Mother   . Heart disease Father   . Breast cancer Other   . Osteoporosis Maternal Grandmother   . Ovarian cancer Cousin 9    Review of Systems  Exam:   LMP 04/06/2008   Weight change: @WEIGHTCHANGE @ Height:      Ht Readings from Last 3 Encounters:  01/05/18 5\' 3"  (1.6 m)  09/03/17 5\' 3"  (1.6 m)  08/27/16 5\' 3"  (1.6 m)    General appearance: alert, cooperative and appears stated age Head: Normocephalic, without obvious abnormality, atraumatic Neck: no adenopathy, supple, symmetrical, trachea midline and thyroid {CHL AMB PHY EX THYROID NORM DEFAULT:[phone]::"normal to inspection and palpation"} Lungs: clear to auscultation bilaterally Cardiovascular: regular rate and rhythm Breasts: {Exam; breast:13139::"normal appearance, no masses or tenderness"} Abdomen: soft, non-tender; non distended,  no masses,  no organomegaly Extremities: extremities normal, atraumatic, no cyanosis or edema Skin: Skin color, texture, turgor normal. No rashes or lesions Lymph nodes: Cervical, supraclavicular, and  axillary nodes normal. No abnormal inguinal nodes palpated Neurologic: Grossly normal   Pelvic: External genitalia:  no lesions              Urethra:  normal appearing urethra with no masses, tenderness or lesions              Bartholins and Skenes: normal                 Vagina: normal appearing vagina with normal color and discharge, no lesions              Cervix: {CHL AMB PHY EX CERVIX NORM [phone]::"no lesions"}               Bimanual Exam:  Uterus:  {CHL AMB PHY EX UTERUS NORM DEFAULT:[phone]::"normal size, contour, position, consistency,  mobility, non-tender"}              Adnexa: {CHL AMB PHY EX ADNEXA NO MASS DEFAULT:[phone]::"no mass, fullness, tenderness"}               Rectovaginal: Confirms               Anus:  normal sphincter tone, no lesions  Chaperone was present for exam.  A:  Well Woman with normal exam  P:

## 2018-09-22 ENCOUNTER — Ambulatory Visit: Payer: BLUE CROSS/BLUE SHIELD | Admitting: Obstetrics and Gynecology

## 2018-11-22 ENCOUNTER — Ambulatory Visit: Payer: Self-pay | Admitting: Family Medicine

## 2018-12-01 ENCOUNTER — Ambulatory Visit: Payer: BLUE CROSS/BLUE SHIELD | Admitting: Obstetrics and Gynecology

## 2018-12-15 DIAGNOSIS — R1310 Dysphagia, unspecified: Secondary | ICD-10-CM | POA: Diagnosis not present

## 2018-12-15 DIAGNOSIS — Z1211 Encounter for screening for malignant neoplasm of colon: Secondary | ICD-10-CM | POA: Diagnosis not present

## 2018-12-15 DIAGNOSIS — K219 Gastro-esophageal reflux disease without esophagitis: Secondary | ICD-10-CM | POA: Diagnosis not present

## 2018-12-20 ENCOUNTER — Ambulatory Visit: Payer: BC Managed Care – PPO | Admitting: Obstetrics and Gynecology

## 2019-01-20 DIAGNOSIS — Z872 Personal history of diseases of the skin and subcutaneous tissue: Secondary | ICD-10-CM | POA: Diagnosis not present

## 2019-01-20 DIAGNOSIS — L578 Other skin changes due to chronic exposure to nonionizing radiation: Secondary | ICD-10-CM | POA: Diagnosis not present

## 2019-01-20 DIAGNOSIS — L570 Actinic keratosis: Secondary | ICD-10-CM | POA: Diagnosis not present

## 2019-01-20 DIAGNOSIS — D229 Melanocytic nevi, unspecified: Secondary | ICD-10-CM | POA: Diagnosis not present

## 2019-01-20 DIAGNOSIS — Z85828 Personal history of other malignant neoplasm of skin: Secondary | ICD-10-CM | POA: Diagnosis not present

## 2019-01-25 NOTE — Progress Notes (Signed)
63 y.o. G3P3 Married White or Caucasian Not Hispanic or Latino female here for annual exam.   She has a h/o a grade 3 cystocele and grade 1 uterine prolapse and rectocele. She previously tried a #7 ring pessary, fell out with BM.  She does feel her prolapse is getting worse. She notices a bulge 3-4 x a day. Occasionally has issues with emptying her bowels.   H/O mixed urinary incontinence. Not often. She will have occasional urge incontinence with a full bladder. Infrequent GSI. All currently tolerable. Sexually active, no pain. No vaginal bleeding.    She c/o intermittent vulvar itching over the last 6 months. No discharge or odor.     Patient's last menstrual period was 04/06/2008.          Sexually active: Yes.    The current method of family planning is post menopausal status.    Exercising: Yes.   Swim aerobics Smoker:  no  Health Maintenance: Pap:  08-27-16 WNL, 08-23-15 WNL  NEG HR HPV  History of abnormal Pap:  Yes LEEP 1997 MMG:  12/08/2017 Birads 1 negative  Colonoscopy:  2009 normal - due this year, scheduled for 02/01/2019 BMD:   Never TDaP:  09-30-16  Gardasil: N/A   reports that she has quit smoking. She has never used smokeless tobacco. She reports current alcohol use of about 5.0 - 6.0 standard drinks of alcohol per week. She reports that she does not use drugs. 3 grown kids, all local, has young grand children. She works in [employer], stressful job  Past Medical History:  Diagnosis Date  . Anxiety   . 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 GERD (gastroesophageal reflux disease)   . Knee cartilage, torn, right 2003  . 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 Prediabetes     Past Surgical History:  Procedure Laterality Date  . 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 COLPOSCOPY  1998  . hypo thermal ablation  06/02/05  . KNEE SURGERY  07/2004  . TONSILLECTOMY AND ADENOIDECTOMY    . WISDOM TOOTH EXTRACTION       Current Outpatient Medications  Medication Sig Dispense Refill  . Biotin 10 MG CAPS Take by mouth daily.    . fluticasone (FLONASE) 50 MCG/ACT nasal spray Place 2 sprays into both nostrils daily. 48 g 3  . omeprazole (PRILOSEC) 20 MG capsule Take 1 capsule (20 mg total) by mouth daily. 90 capsule 3  . sertraline (ZOLOFT) 100 MG tablet Take 1 tablet (100 mg total) by mouth daily. 90 tablet 3   No current facility-administered medications for this visit.     Family History  Problem Relation Age of Onset  . Hypertension Mother   . Heart disease Father   . Breast cancer Other   . Osteoporosis Maternal Grandmother   . Ovarian cancer Cousin 70    Review of Systems  Constitutional: Negative.   HENT: Negative.   Eyes: Negative.   Respiratory: Negative.   Cardiovascular: Negative.   Gastrointestinal: Negative.   Endocrine: Negative.   Genitourinary:       Vaginal itching  Musculoskeletal: Negative.   Skin: Negative.   Allergic/Immunologic: Negative.   Neurological: Negative.   Hematological: Negative.   Psychiatric/Behavioral: Negative.     Exam:   BP 128/74 (BP Location: Right Arm, Patient Position: Sitting, Cuff Size: Normal)   Pulse 72   Temp 98.7 F (37.1 C) (Skin)   Ht 5\' 3"  (1.6  m)   Wt 220 lb (99.8 kg)   LMP 04/06/2008   BMI 38.97 kg/m   Weight change: @WEIGHTCHANGE @ Height:   Height: 5\' 3"  (160 cm)  Ht Readings from Last 3 Encounters:  01/26/19 5\' 3"  (1.6 m)  01/05/18 5\' 3"  (1.6 m)  09/03/17 5\' 3"  (1.6 m)    General appearance: alert, cooperative and appears stated age Head: Normocephalic, without obvious abnormality, atraumatic Neck: no adenopathy, supple, symmetrical, trachea midline and thyroid normal to inspection and palpation Lungs: clear to auscultation bilaterally Cardiovascular: regular rate and rhythm Breasts: normal appearance, no masses or tenderness Abdomen: soft, non-tender; non distended,  no masses,  no organomegaly Extremities:  extremities normal, atraumatic, no cyanosis or edema Skin: Skin color, texture, turgor normal. No rashes or lesions Lymph nodes: Cervical, supraclavicular, and axillary nodes normal. No abnormal inguinal nodes palpated Neurologic: Grossly normal   Pelvic: External genitalia:  Some whitening on her perineum and perianal region.              Urethra:  normal appearing urethra with no masses, tenderness or lesions              Bartholins and Skenes: normal                 Vagina: normal appearing vagina with grade 2-3 cystocele, grade 1 uterine prolapse and grade 1 rectocele. Examined supine and standing, with and without valsalva.               Cervix: no lesions               Bimanual Exam:  Uterus:  normal size, contour, position, consistency, mobility, non-tender              Adnexa: no mass, fullness, tenderness               Rectovaginal: Confirms               Anus:  normal sphincter tone, no lesions  Chaperone was present for exam.  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 Good candidate for vaginal surgery. We discussed that if I can't get her ovaries vaginally and she really wanted them out, she would need a laparoscopy  Discussed risk of worsening GSI with pessary or surgery, if she leaks with the pessary, would do urodynamics prior to surgery  Affirm sent  Vulvar skin care information given  Steroid ointment   Information on prolapse given

## 2019-01-26 ENCOUNTER — Other Ambulatory Visit: Payer: Self-pay

## 2019-01-26 ENCOUNTER — Encounter: Payer: Self-pay | Admitting: Obstetrics and Gynecology

## 2019-01-26 ENCOUNTER — Ambulatory Visit (INDEPENDENT_AMBULATORY_CARE_PROVIDER_SITE_OTHER): Payer: BC Managed Care – PPO | Admitting: Obstetrics and Gynecology

## 2019-01-26 VITALS — BP 128/74 | HR 72 | Temp 98.7°F | Ht 63.0 in | Wt 220.0 lb

## 2019-01-26 DIAGNOSIS — N8111 Cystocele, midline: Secondary | ICD-10-CM

## 2019-01-26 DIAGNOSIS — E559 Vitamin D deficiency, unspecified: Secondary | ICD-10-CM | POA: Diagnosis not present

## 2019-01-26 DIAGNOSIS — Z01419 Encounter for gynecological examination (general) (routine) without abnormal findings: Secondary | ICD-10-CM

## 2019-01-26 DIAGNOSIS — Z6838 Body mass index (BMI) 38.0-38.9, adult: Secondary | ICD-10-CM

## 2019-01-26 DIAGNOSIS — N816 Rectocele: Secondary | ICD-10-CM

## 2019-01-26 DIAGNOSIS — N814 Uterovaginal prolapse, unspecified: Secondary | ICD-10-CM

## 2019-01-26 DIAGNOSIS — R7303 Prediabetes: Secondary | ICD-10-CM

## 2019-01-26 DIAGNOSIS — N763 Subacute and chronic vulvitis: Secondary | ICD-10-CM

## 2019-01-26 DIAGNOSIS — Z0000 Encounter for general adult medical examination without abnormal findings: Secondary | ICD-10-CM | POA: Diagnosis not present

## 2019-01-26 MED ORDER — BETAMETHASONE VALERATE 0.1 % EX OINT: TOPICAL_OINTMENT | CUTANEOUS | 0 refills | Status: DC

## 2019-01-26 NOTE — Patient Instructions (Signed)

## 2019-01-27 LAB — LIPID PANEL
Chol/HDL Ratio: 4.2 ratio (ref 0.0–4.4)
Cholesterol, Total: 211 mg/dL — ABNORMAL HIGH (ref 100–199)
HDL: 50 mg/dL (ref >39)
LDL Calculated: 130 mg/dL — ABNORMAL HIGH (ref 0–99)
Triglycerides: 154 mg/dL — ABNORMAL HIGH (ref 0–149)
VLDL Cholesterol Cal: 31 mg/dL (ref 5–40)

## 2019-01-27 LAB — COMPREHENSIVE METABOLIC PANEL
ALT: 23 IU/L (ref 0–32)
AST: 20 IU/L (ref 0–40)
Albumin/Globulin Ratio: 2 (ref 1.2–2.2)
Albumin: 4.7 g/dL (ref 3.8–4.9)
Alkaline Phosphatase: 77 IU/L (ref 39–117)
BUN/Creatinine Ratio: 27 (ref 12–28)
BUN: 18 mg/dL (ref 8–27)
Bilirubin Total: 0.4 mg/dL (ref 0.0–1.2)
CO2: 22 mmol/L (ref 20–29)
Calcium: 9.5 mg/dL (ref 8.7–10.3)
Chloride: 102 mmol/L (ref 96–106)
Creatinine, Ser: 0.67 mg/dL (ref 0.57–1.00)
GFR calc Af Amer: 110 mL/min/{1.73_m2} (ref >59)
GFR calc non Af Amer: 96 mL/min/{1.73_m2} (ref >59)
Globulin, Total: 2.3 g/dL (ref 1.5–4.5)
Glucose: 92 mg/dL (ref 65–99)
Potassium: 4.3 mmol/L (ref 3.5–5.2)
Sodium: 140 mmol/L (ref 134–144)
Total Protein: 7 g/dL (ref 6.0–8.5)

## 2019-01-27 LAB — VAGINITIS/VAGINOSIS, DNA PROBE
Candida Species: NEGATIVE
Gardnerella vaginalis: NEGATIVE
Trichomonas vaginosis: NEGATIVE

## 2019-01-27 LAB — CBC
Hematocrit: 44 % (ref 34.0–46.6)
Hemoglobin: 14.2 g/dL (ref 11.1–15.9)
MCH: 28.7 pg (ref 26.6–33.0)
MCHC: 32.3 g/dL (ref 31.5–35.7)
MCV: 89 fL (ref 79–97)
Platelets: 238 10*3/uL (ref 150–450)
RBC: 4.94 x10E6/uL (ref 3.77–5.28)
RDW: 13.3 % (ref 11.7–15.4)
WBC: 7.4 10*3/uL (ref 3.4–10.8)

## 2019-01-27 LAB — TSH: TSH: 1.4 u[IU]/mL (ref 0.450–4.500)

## 2019-01-27 LAB — HEMOGLOBIN A1C
Est. average glucose Bld gHb Est-mCnc: 117 mg/dL
Hgb A1c MFr Bld: 5.7 % — ABNORMAL HIGH (ref 4.8–5.6)

## 2019-01-27 LAB — VITAMIN D 25 HYDROXY (VIT D DEFICIENCY, FRACTURES): Vit D, 25-Hydroxy: 21.4 ng/mL — ABNORMAL LOW (ref 30.0–100.0)

## 2019-02-01 DIAGNOSIS — K5730 Diverticulosis of large intestine without perforation or abscess without bleeding: Secondary | ICD-10-CM | POA: Diagnosis not present

## 2019-02-01 DIAGNOSIS — D131 Benign neoplasm of stomach: Secondary | ICD-10-CM | POA: Diagnosis not present

## 2019-02-01 DIAGNOSIS — R1310 Dysphagia, unspecified: Secondary | ICD-10-CM | POA: Diagnosis not present

## 2019-02-01 DIAGNOSIS — Z1211 Encounter for screening for malignant neoplasm of colon: Secondary | ICD-10-CM | POA: Diagnosis not present

## 2019-03-18 ENCOUNTER — Other Ambulatory Visit: Payer: Self-pay | Admitting: Obstetrics and Gynecology

## 2019-03-18 DIAGNOSIS — Z1231 Encounter for screening mammogram for malignant neoplasm of breast: Secondary | ICD-10-CM

## 2019-04-20 ENCOUNTER — Ambulatory Visit
Admission: RE | Admit: 2019-04-20 | Discharge: 2019-04-20 | Disposition: A | Discharge status: Home / Self Care | Payer: BC Managed Care – PPO | Source: Ambulatory Visit | Attending: Obstetrics and Gynecology | Admitting: Obstetrics and Gynecology

## 2019-04-20 DIAGNOSIS — Z1231 Encounter for screening mammogram for malignant neoplasm of breast: Secondary | ICD-10-CM | POA: Diagnosis not present

## 2019-04-20 IMAGING — CR DG CHEST 2V
2 series · 2 of 2 positions shown · non-contrast
Comparison: Chest CT 04/04/2009

CLINICAL DATA: Cough.  Decreased oxygen saturation.

EXAM:
CHEST - 2 VIEW

[chest pa]
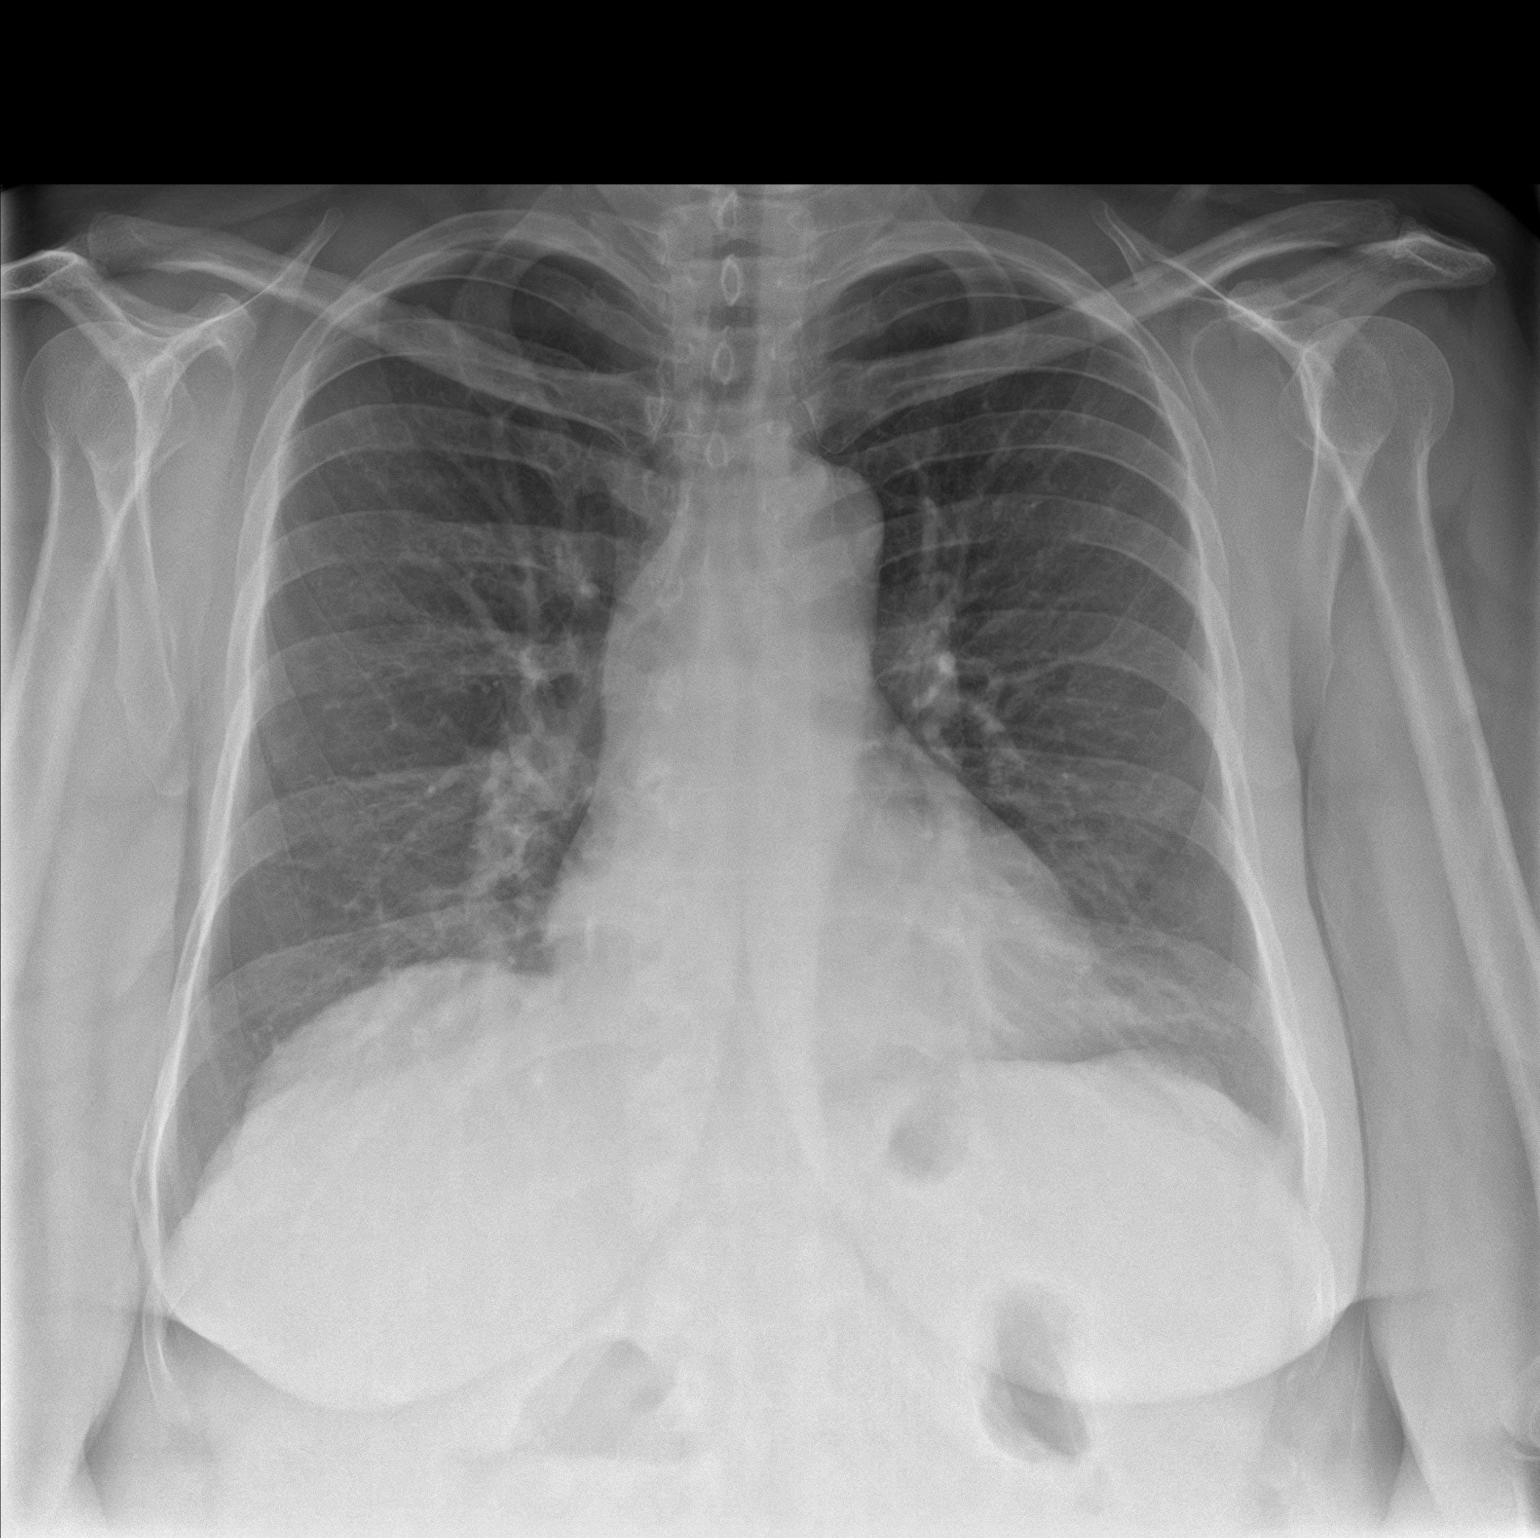

[chest lat]
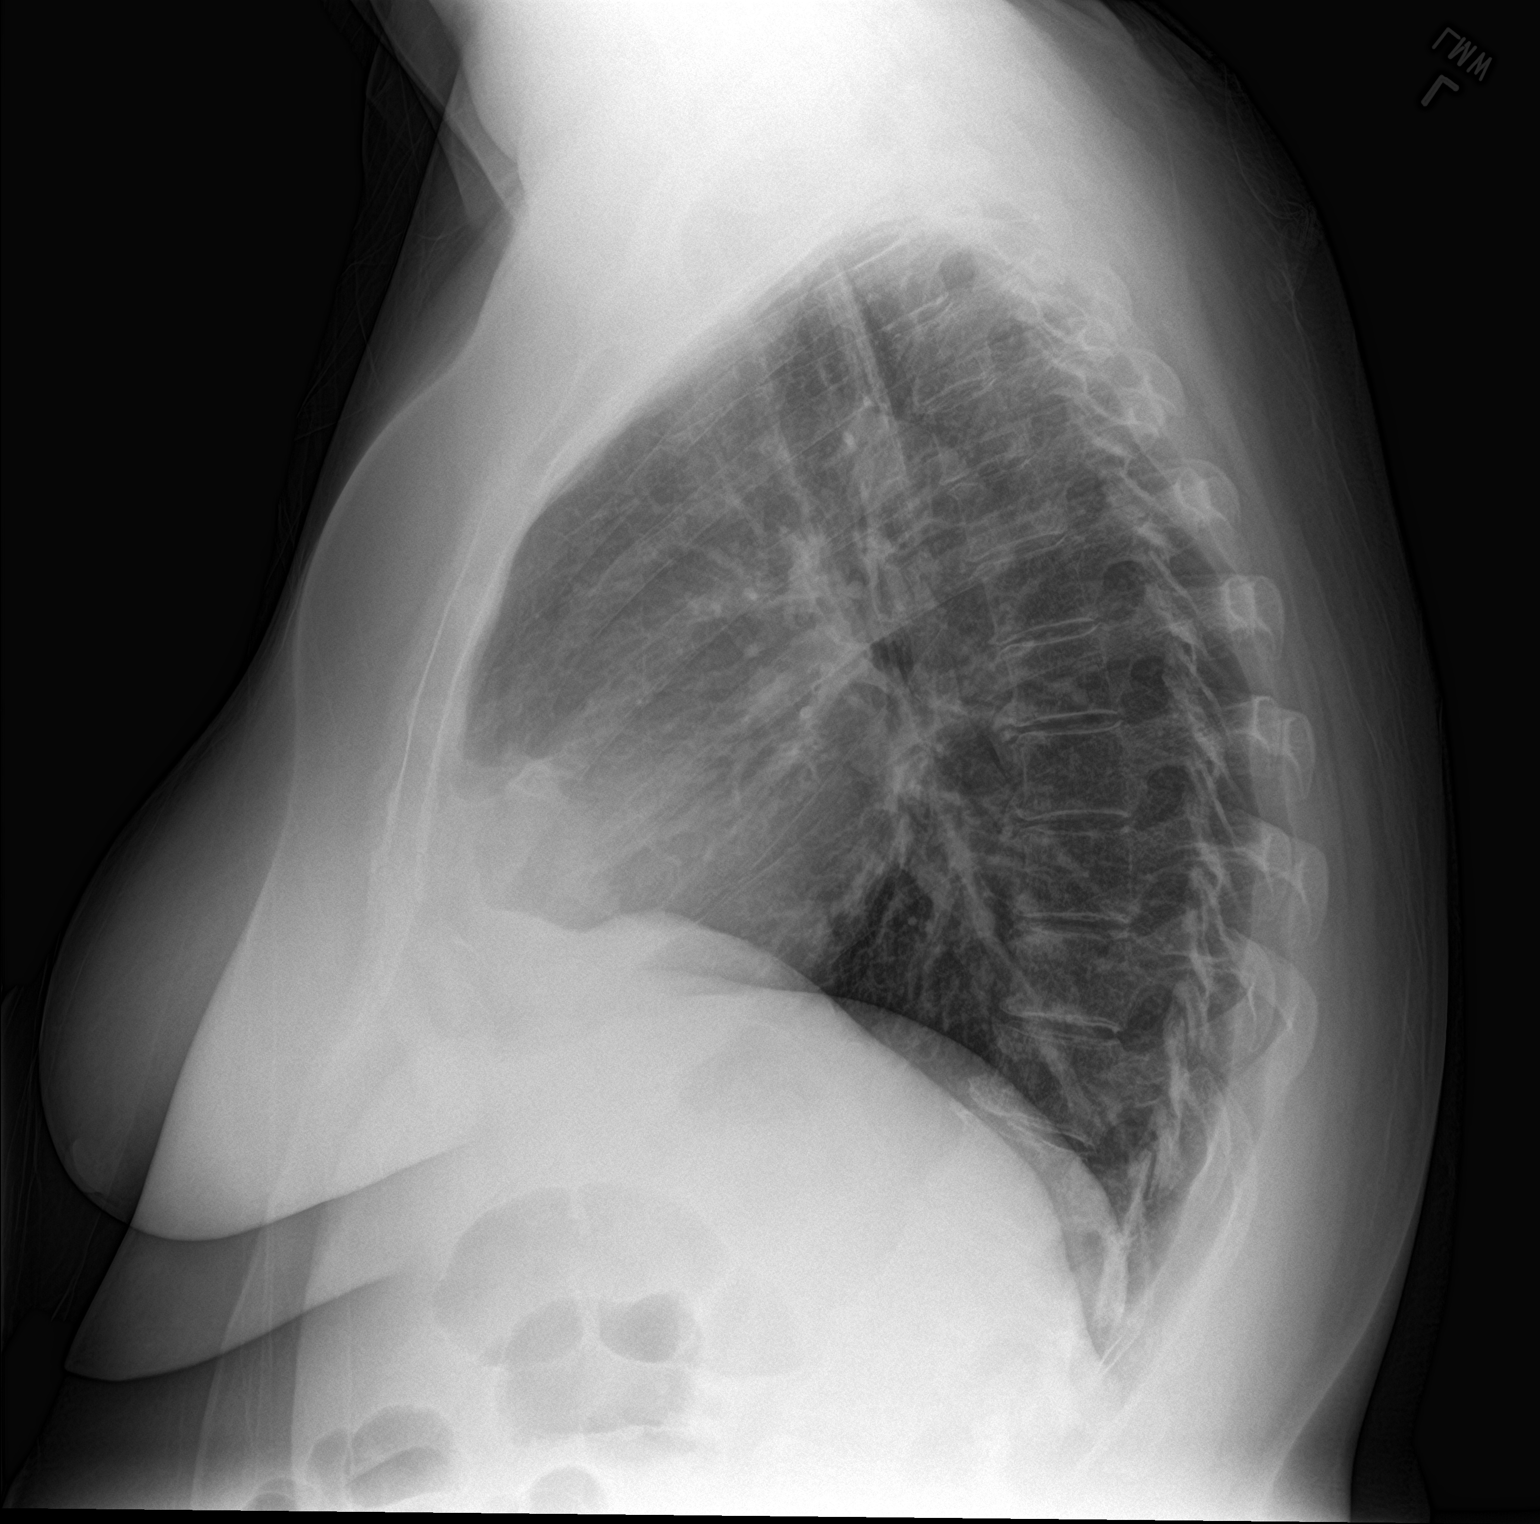

[2 of 2 positions shown; findings below may reference images not displayed]

FINDINGS: Stable cardiac and mediastinal contours. Left basilar atelectasis.
No pleural effusion or pneumothorax. Thoracic spine degenerative
changes.
IMPRESSION: No acute cardiopulmonary process.

## 2019-07-08 DIAGNOSIS — R002 Palpitations: Secondary | ICD-10-CM

## 2019-07-08 HISTORY — DX: Palpitations: R00.2

## 2019-08-04 ENCOUNTER — Other Ambulatory Visit: Payer: Self-pay | Admitting: Family Medicine

## 2019-08-04 DIAGNOSIS — F419 Anxiety disorder, unspecified: Secondary | ICD-10-CM

## 2019-08-04 NOTE — Telephone Encounter (Signed)
Requested medication (s) are due for refill today: yes  Requested medication (s) are on the active medication list: yes  Last refill:  05/04/2019  Future visit scheduled: no  Notes to clinic:  no valid encounter within last 6 months   Requested Prescriptions  Pending Prescriptions Disp Refills   sertraline (ZOLOFT) 100 MG tablet [Pharmacy Med Name: SERTRALINE HCL 100 MG TABLET] 90 tablet 3    Sig: TAKE 1 TABLET BY MOUTH EVERY DAY      Psychiatry:  Antidepressants - SSRI Failed - 08/04/2019  1:22 AM      Failed - Valid encounter within last 6 months    Recent Outpatient Visits           1 year ago Need for zoster vaccination   [facility] Jerrol Banana., MD   1 year ago Acute non-recurrent pansinusitis   [facility] [place], Dionne Bucy, MD   1 year ago Need for zoster vaccination   [facility] Jerrol Banana., MD   1 year ago Borderline diabetes   [facility] Jerrol Banana., MD   2 years ago [place] Jerrol Banana., MD

## 2019-08-05 ENCOUNTER — Other Ambulatory Visit: Payer: Self-pay | Admitting: Family Medicine

## 2019-08-05 DIAGNOSIS — K219 Gastro-esophageal reflux disease without esophagitis: Secondary | ICD-10-CM

## 2019-08-05 NOTE — Telephone Encounter (Signed)
Requested medication (s) are due for refill today: yes  Requested medication (s) are on the active medication list: yes  Last refill:  05/16/2019  Future visit scheduled: no  Notes to clinic:  Please review for refill. No recent office visit in over a year.     Requested Prescriptions  Pending Prescriptions Disp Refills   omeprazole (PRILOSEC) 20 MG capsule [Pharmacy Med Name: OMEPRAZOL RX CAP 20MG ] 90 capsule 3    Sig: TAKE 1 CAPSULE DAILY      Gastroenterology: Proton Pump Inhibitors Failed - 08/05/2019  5:50 PM      Failed - Valid encounter within last 12 months    Recent Outpatient Visits           1 year ago Need for zoster vaccination   [facility] Jerrol Banana., MD   1 year ago Acute non-recurrent pansinusitis   [facility] [place] [place], Dionne Bucy, MD   1 year ago Need for zoster vaccination   [facility] Jerrol Banana., MD   1 year ago Borderline diabetes   [facility] Jerrol Banana., MD   2 years ago [place] Jerrol Banana., MD

## 2019-08-08 NOTE — Telephone Encounter (Signed)
Patient last office visit was 05/24/2018. Please advise?

## 2019-08-09 ENCOUNTER — Encounter: Payer: Self-pay | Admitting: Family Medicine

## 2019-08-10 ENCOUNTER — Telehealth: Payer: Self-pay

## 2019-08-10 NOTE — Telephone Encounter (Signed)
Patient's medication was sent to [facility] on 08/04/19

## 2019-08-18 ENCOUNTER — Other Ambulatory Visit: Payer: Self-pay | Admitting: Family Medicine

## 2019-08-18 DIAGNOSIS — F419 Anxiety disorder, unspecified: Secondary | ICD-10-CM

## 2019-09-05 DIAGNOSIS — Z87442 Personal history of urinary calculi: Secondary | ICD-10-CM

## 2019-09-05 HISTORY — DX: Personal history of urinary calculi: Z87.442

## 2019-09-12 ENCOUNTER — Emergency Department: Payer: BC Managed Care – PPO

## 2019-09-12 ENCOUNTER — Other Ambulatory Visit: Payer: Self-pay

## 2019-09-12 ENCOUNTER — Emergency Department
Admission: EM | Admit: 2019-09-12 | Discharge: 2019-09-12 | Disposition: A | Discharge status: Home / Self Care | Payer: BC Managed Care – PPO | Attending: Emergency Medicine | Admitting: Emergency Medicine

## 2019-09-12 DIAGNOSIS — R1110 Vomiting, unspecified: Secondary | ICD-10-CM | POA: Diagnosis not present

## 2019-09-12 DIAGNOSIS — Z79899 Other long term (current) drug therapy: Secondary | ICD-10-CM | POA: Insufficient documentation

## 2019-09-12 DIAGNOSIS — N200 Calculus of kidney: Secondary | ICD-10-CM | POA: Diagnosis not present

## 2019-09-12 DIAGNOSIS — Z87891 Personal history of nicotine dependence: Secondary | ICD-10-CM | POA: Diagnosis not present

## 2019-09-12 DIAGNOSIS — R1032 Left lower quadrant pain: Secondary | ICD-10-CM | POA: Diagnosis not present

## 2019-09-12 LAB — COMPREHENSIVE METABOLIC PANEL
ALT: 32 U/L (ref 0–44)
AST: 23 U/L (ref 15–41)
Albumin: 4.2 g/dL (ref 3.5–5.0)
Alkaline Phosphatase: 61 U/L (ref 38–126)
Anion gap: 7 (ref 5–15)
BUN: 16 mg/dL (ref 8–23)
CO2: 24 mmol/L (ref 22–32)
Calcium: 9 mg/dL (ref 8.9–10.3)
Chloride: 107 mmol/L (ref 98–111)
Creatinine, Ser: 0.71 mg/dL (ref 0.44–1.00)
GFR calc Af Amer: >60 mL/min (ref >60)
GFR calc non Af Amer: >60 mL/min (ref >60)
Glucose, Bld: 151 mg/dL — ABNORMAL HIGH (ref 70–99)
Potassium: 3.8 mmol/L (ref 3.5–5.1)
Sodium: 138 mmol/L (ref 135–145)
Total Bilirubin: 0.6 mg/dL (ref 0.3–1.2)
Total Protein: 7.2 g/dL (ref 6.5–8.1)

## 2019-09-12 LAB — URINALYSIS, COMPLETE (UACMP) WITH MICROSCOPIC
Bacteria, UA: NONE SEEN
Bilirubin Urine: NEGATIVE
Glucose, UA: NEGATIVE mg/dL
Ketones, ur: NEGATIVE mg/dL
Nitrite: NEGATIVE
Protein, ur: NEGATIVE mg/dL
RBC / HPF: >50 RBC/hpf — ABNORMAL HIGH (ref 0–5)
Specific Gravity, Urine: >1.046 — ABNORMAL HIGH (ref 1.005–1.030)
pH: 5 (ref 5.0–8.0)

## 2019-09-12 LAB — CBC
HCT: 37.8 % (ref 36.0–46.0)
Hemoglobin: 12.5 g/dL (ref 12.0–15.0)
MCH: 28.9 pg (ref 26.0–34.0)
MCHC: 33.1 g/dL (ref 30.0–36.0)
MCV: 87.3 fL (ref 80.0–100.0)
Platelets: 205 10*3/uL (ref 150–400)
RBC: 4.33 MIL/uL (ref 3.87–5.11)
RDW: 12.6 % (ref 11.5–15.5)
WBC: 6.7 10*3/uL (ref 4.0–10.5)
nRBC: 0 % (ref 0.0–0.2)

## 2019-09-12 LAB — LIPASE, BLOOD: Lipase: 29 U/L (ref 11–51)

## 2019-09-12 MED ORDER — ONDANSETRON HCL 4 MG/2ML IJ SOLN
4.0000 mg | Freq: Once | INTRAMUSCULAR | Status: AC
  Administered 2019-09-12: 4 mg via INTRAVENOUS

## 2019-09-12 MED ORDER — ONDANSETRON HCL 4 MG/2ML IJ SOLN
INTRAMUSCULAR | Status: AC
  Filled 2019-09-12: qty 2

## 2019-09-12 MED ORDER — KETOROLAC TROMETHAMINE 30 MG/ML IJ SOLN
30.0000 mg | Freq: Once | INTRAMUSCULAR | Status: AC
  Administered 2019-09-12: 30 mg via INTRAVENOUS

## 2019-09-12 MED ORDER — MORPHINE SULFATE (PF) 4 MG/ML IV SOLN
INTRAVENOUS | Status: AC
  Filled 2019-09-12: qty 1

## 2019-09-12 MED ORDER — MORPHINE SULFATE (PF) 4 MG/ML IV SOLN
4.0000 mg | Freq: Once | INTRAVENOUS | Status: AC
  Administered 2019-09-12: 4 mg via INTRAVENOUS

## 2019-09-12 MED ORDER — OXYCODONE-ACETAMINOPHEN 5-325 MG PO TABS: 1.0000 | ORAL_TABLET | Freq: Four times a day (QID) | ORAL | 0 refills | Status: DC | PRN

## 2019-09-12 MED ORDER — ONDANSETRON 4 MG PO TBDP: 4.0000 mg | ORAL_TABLET | Freq: Three times a day (TID) | ORAL | 0 refills | Status: DC | PRN

## 2019-09-12 MED ORDER — NAPROXEN 500 MG PO TABS: 500.0000 mg | ORAL_TABLET | Freq: Two times a day (BID) | ORAL | 2 refills | Status: DC

## 2019-09-12 MED ORDER — TAMSULOSIN HCL 0.4 MG PO CAPS: 0.4000 mg | ORAL_CAPSULE | Freq: Every day | ORAL | 0 refills | Status: DC

## 2019-09-12 MED ORDER — OXYCODONE-ACETAMINOPHEN 5-325 MG PO TABS: 1.0000 | ORAL_TABLET | ORAL | 0 refills | Status: DC | PRN

## 2019-09-12 MED ORDER — KETOROLAC TROMETHAMINE 30 MG/ML IJ SOLN
INTRAMUSCULAR | Status: AC
  Filled 2019-09-12: qty 1

## 2019-09-12 MED ORDER — IOHEXOL 300 MG/ML  SOLN
100.0000 mL | Freq: Once | INTRAMUSCULAR | Status: AC | PRN
  Administered 2019-09-12: 100 mL via INTRAVENOUS

## 2019-09-12 NOTE — ED Notes (Signed)
Family updated as to patient's status.

## 2019-09-12 NOTE — ED Notes (Signed)
Return from CT Scan.

## 2019-09-12 NOTE — ED Triage Notes (Signed)
Patient c/o LLQ abdominal pain and vomiting that woke her from sleep this am. Patient reports emesis, denies diarrhea.

## 2019-09-12 NOTE — ED Notes (Signed)
Pt to CT

## 2019-09-12 NOTE — ED Provider Notes (Signed)
[facility] Emergency Department Provider Note ____________________________________________   First MD Initiated Contact with Patient 09/12/19 [phone]     (approximate)  I have reviewed the triage vital signs and the nursing notes.   HISTORY  Chief Complaint Abdominal Pain   HPI Evelyn Hayes is a 64 y.o. female with below list of previous medical conditions presents to the emergency department secondary to acute onset of left lower quadrant abdominal pain that is currently 10 out of 10 with associated nausea  and vomiting.  Patient denies any diarrhea no urinary symptoms no fever.        Past Medical History:  Diagnosis Date  . Anxiety   . 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 GERD (gastroesophageal reflux disease)   . Knee cartilage, torn, right 2003  . 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 Prediabetes     Patient Active Problem List   Diagnosis Date Noted  . Adiposity 01/07/2018  . 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 Adult-onset obesity 07/10/2015  . Allergic rhinitis 07/10/2015  . Anxiety 07/10/2015  . Abnormal liver enzymes 07/10/2015  . Acid reflux 07/10/2015  . Hypercholesteremia 07/10/2015  . Borderline diabetes 07/10/2015  . Apnea, sleep 07/10/2015  . Avitaminosis D 07/10/2015    Past Surgical History:  Procedure Laterality Date  . 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 COLPOSCOPY  1998  . hypo thermal ablation  06/02/05  . KNEE SURGERY  07/2004  . TONSILLECTOMY AND ADENOIDECTOMY    . WISDOM TOOTH EXTRACTION      Prior to Admission medications   Medication Sig Start Date End Date Taking? Authorizing Provider  betamethasone valerate ointment (VALISONE) 0.1 % Apply a pea sized amount topically BID for up to 2 weeks. Not meant for daily long term use. 01/26/19   Salvadore Dom, MD  Biotin 10 MG CAPS Take by mouth daily.    [provider]  fluticasone (FLONASE) 50  MCG/ACT nasal spray Place 2 sprays into both nostrils daily. 09/17/15   Jerrol Banana., MD  omeprazole (PRILOSEC) 20 MG capsule TAKE 1 CAPSULE DAILY 08/08/19   Jerrol Banana., MD  ondansetron [facility] ODT) 4 MG disintegrating tablet Take 1 tablet (4 mg total) by mouth every 8 (eight) hours as needed. 09/12/19   Gregor Hams, MD  oxyCODONE-acetaminophen (PERCOCET) 5-325 MG tablet Take 1 tablet by mouth every 4 (four) hours as needed. 09/12/19 09/11/20  Gregor Hams, MD  sertraline (ZOLOFT) 100 MG tablet TAKE 1 TABLET BY MOUTH EVERY DAY 08/04/19   Jerrol Banana., MD    Allergies Patient has no known allergies.  Family History  Problem Relation Age of Onset  . Hypertension Mother   . Heart disease Father   . Breast cancer Other   . Osteoporosis Maternal Grandmother   . Ovarian cancer Cousin 36    Social History Social History   Tobacco Use  . Smoking status: Former Research scientist (life sciences)  . Smokeless tobacco: Never Used  . Tobacco comment: smoked for about 2 years in high school  Substance Use Topics  . Alcohol use: Yes    Alcohol/week: 5.0 - 6.0 standard drinks    Types: 5 - 6 Standard drinks or equivalent per week  . Drug use: No    Review of Systems Constitutional: No fever/chills Eyes: No visual changes. ENT: No sore throat. Cardiovascular: Denies chest pain. Respiratory: Denies shortness of breath. Gastrointestinal: Positive for  abdominal pain nausea vomiting Genitourinary: Negative for dysuria. Musculoskeletal: Negative for neck pain.  Negative for back pain. Integumentary: Negative for rash. Neurological: Negative for headaches, focal weakness or numbness.   ____________________________________________   PHYSICAL EXAM:  VITAL SIGNS: ED Triage Vitals  Enc Vitals Group     BP 09/12/19 0613 (!) 159/121     Pulse Rate 09/12/19 0613 71     Resp --      Temp 09/12/19 0613 97.7 F (36.5 C)     Temp Source 09/12/19 0613 Oral     SpO2 09/12/19 0613 97 %       Weight 09/12/19 0608 99.8 kg (220 lb 0.3 oz)     Height --      Head Circumference --      Peak Flow --      Pain Score 09/12/19 0608 8     Pain Loc --      Pain Edu? --      Excl. in [place]? --     Constitutional: Alert and oriented.  Apparent discomfort Eyes: Conjunctivae are normal.  Mouth/Throat: Patient is wearing a mask. Neck: No stridor.  No meningeal signs.   Cardiovascular: Normal rate, regular rhythm. Good peripheral circulation. Grossly normal heart sounds. Respiratory: Normal respiratory effort.  No retractions. Gastrointestinal: Left lower quadrant tenderness to palpation.  Also right upper quadrant tenderness to palpation. no guarding or rebound Musculoskeletal: No lower extremity tenderness nor edema. No gross deformities of extremities. Neurologic:  Normal speech and language. No gross focal neurologic deficits are appreciated.  Skin:  Skin is warm, dry and intact. Psychiatric: Mood and affect are normal. Speech and behavior are normal.  ____________________________________________   LABS (all labs ordered are listed, but only abnormal results are displayed)  Labs Reviewed  COMPREHENSIVE METABOLIC PANEL - Abnormal; Notable for the following components:      Result Value   Glucose, Bld 151 (*)    All other components within normal limits  LIPASE, BLOOD  CBC  URINALYSIS, COMPLETE (UACMP) WITH MICROSCOPIC     RADIOLOGY I, Reedy N Izaya [place], personally viewed and evaluated these images (plain radiographs) as part of my medical decision making, as well as reviewing the written report by the radiologist.  ED MD interpretation: 2 mm left UVJ stone  Official radiology report(s): CT ABDOMEN PELVIS W CONTRAST  Result Date: 09/12/2019 CLINICAL DATA:  Left lower quadrant abdominal pain with vomiting. EXAM: CT ABDOMEN AND PELVIS WITH CONTRAST TECHNIQUE: Multidetector CT imaging of the abdomen and pelvis was performed using the standard protocol following bolus  administration of intravenous contrast. CONTRAST:  11mL OMNIPAQUE IOHEXOL 300 MG/ML  SOLN COMPARISON:  None. FINDINGS: Lower chest:  No contributory findings. Hepatobiliary: No focal liver abnormality.No evidence of biliary obstruction or stone. Pancreas: Unremarkable. Spleen: Unremarkable. Adrenals/Urinary Tract: Negative adrenals. Left hydronephrosis and peripelvic stranding with mild hydroureter and lower ureteral thickening due to a 2 mm stone at the UVJ. No additional urolithiasis. Stomach/Bowel: No obstruction. No appendicitis. Large but uncomplicated duodenal diverticulum. Vascular/Lymphatic: No acute vascular abnormality. No mass or adenopathy. Reproductive:No pathologic findings. Other: No ascites or pneumoperitoneum. Fatty right inguinal hernia. Small fatty umbilical hernia. Musculoskeletal: No acute abnormalities. Spondylosis and levoscoliosis. IMPRESSION: Obstructing 2 mm left UVJ calculus. Electronically Signed   By: Monte Fantasia M.D.   On: 09/12/2019 07:21    ____________________________________________    Procedures   ____________________________________________   INITIAL IMPRESSION / MDM / [place] / ED COURSE  As part of my medical decision  making, I reviewed the following data within the electronic MEDICAL RECORD NUMBER   64 year old female presented with above-stated history and physical exam a differential diagnosis including but not limited to ureterolithiasis, diverticulitis and much less likely ovarian cyst.  Given patient's apparent and stated discomfort patient received 4 mg IV morphine with minimal improvement and then subsequently an additional 4 mg IV.  Patient was given Zofran 4 mg IV.  Patient subsequently given the IV Toradol 30 mg after I reviewed her CT scan which revealed a 2 mm left UVJ stone.  Patient care transferred to Dr. Corky Downs urinalysis pending  ____________________________________________  FINAL CLINICAL IMPRESSION(S) / ED DIAGNOSES  Final  diagnoses:  Kidney stone on left side     MEDICATIONS GIVEN DURING THIS VISIT:  Medications  morphine 4 MG/ML injection 4 mg (4 mg Intravenous Given 09/12/19 0626)  ondansetron (ZOFRAN) injection 4 mg (4 mg Intravenous Given 09/12/19 0622)  morphine 4 MG/ML injection 4 mg (4 mg Intravenous Given 09/12/19 0640)  iohexol (OMNIPAQUE) 300 MG/ML solution 100 mL (100 mLs Intravenous Contrast Given 09/12/19 0658)  ketorolac (TORADOL) 30 MG/ML injection 30 mg (30 mg Intravenous Given 09/12/19 0718)  ondansetron (ZOFRAN) injection 4 mg (4 mg Intravenous Given 09/12/19 0717)     ED Discharge Orders         Ordered    oxyCODONE-acetaminophen (PERCOCET) 5-325 MG tablet  Every 4 hours PRN     09/12/19 0717    ondansetron (ZOFRAN ODT) 4 MG disintegrating tablet  Every 8 hours PRN     09/12/19 0717          *Please note:  Evelyn Hayes was evaluated in Emergency Department on 09/12/2019 for the symptoms described in the history of present illness. She was evaluated in the context of the global COVID-19 pandemic, which necessitated consideration that the patient might be at risk for infection with the SARS-CoV-2 virus that causes COVID-19. Institutional protocols and algorithms that pertain to the evaluation of patients at risk for COVID-19 are in a state of rapid change based on information released by regulatory bodies including the CDC and federal and state organizations. These policies and algorithms were followed during the patient's care in the ED.  Some ED evaluations and interventions may be delayed as a result of limited staffing during the pandemic.*  Note:  This document was prepared using Dragon voice recognition software and may include unintentional dictation errors.   Gregor Hams, MD 09/12/19 2234

## 2019-10-27 ENCOUNTER — Ambulatory Visit: Payer: BC Managed Care – PPO | Attending: Internal Medicine

## 2019-10-27 DIAGNOSIS — Z20822 Contact with and (suspected) exposure to covid-19: Secondary | ICD-10-CM

## 2019-10-28 ENCOUNTER — Other Ambulatory Visit: Payer: Self-pay | Admitting: Family Medicine

## 2019-10-28 DIAGNOSIS — K219 Gastro-esophageal reflux disease without esophagitis: Secondary | ICD-10-CM

## 2019-10-28 NOTE — Telephone Encounter (Signed)
Requested medication (s) are due for refill today:  Yes  Requested medication (s) are on the active medication list:  Yes  Future visit scheduled:  Yes  Last Refill: 08/08/19 #90; no refills  Note to clinic:  attempted to call pt. To schedule OV; last appt. 05/2018; left pt. Vm to call to schedule an appt.; please advise.  Requested Prescriptions  Pending Prescriptions Disp Refills   omeprazole (PRILOSEC) 20 MG capsule [Pharmacy Med Name: OMEPRAZOL RX CAP 20MG ] 90 capsule 0    Sig: TAKE 1 CAPSULE DAILY      Gastroenterology: Proton Pump Inhibitors Failed - 10/28/2019  6:52 PM      Failed - Valid encounter within last 12 months    Recent Outpatient Visits           1 year ago Need for zoster vaccination   [facility] Jerrol Banana., MD   1 year ago Acute non-recurrent pansinusitis   [facility] [place], Dionne Bucy, MD   1 year ago Need for zoster vaccination   [facility] Jerrol Banana., MD   1 year ago Borderline diabetes   [facility] Jerrol Banana., MD   2 years ago [place] Jerrol Banana., MD

## 2019-10-29 LAB — SARS-COV-2, NAA 2 DAY TAT

## 2019-10-29 LAB — NOVEL CORONAVIRUS, NAA: SARS-CoV-2, NAA: NOT DETECTED

## 2020-01-24 DIAGNOSIS — L578 Other skin changes due to chronic exposure to nonionizing radiation: Secondary | ICD-10-CM | POA: Diagnosis not present

## 2020-01-24 DIAGNOSIS — Z85828 Personal history of other malignant neoplasm of skin: Secondary | ICD-10-CM | POA: Diagnosis not present

## 2020-01-24 DIAGNOSIS — D485 Neoplasm of uncertain behavior of skin: Secondary | ICD-10-CM | POA: Diagnosis not present

## 2020-01-24 DIAGNOSIS — Z872 Personal history of diseases of the skin and subcutaneous tissue: Secondary | ICD-10-CM | POA: Diagnosis not present

## 2020-01-30 ENCOUNTER — Ambulatory Visit: Payer: BC Managed Care – PPO | Admitting: Obstetrics and Gynecology

## 2020-02-24 ENCOUNTER — Ambulatory Visit: Payer: Self-pay | Admitting: *Deleted

## 2020-02-24 DIAGNOSIS — R0609 Other forms of dyspnea: Secondary | ICD-10-CM

## 2020-02-24 HISTORY — DX: Other forms of dyspnea: R06.09

## 2020-02-24 NOTE — Telephone Encounter (Signed)
  Patient is calling to report she has been having palpitation/heart racing for 3 days on/off when it occurs- patient states it does effect her breathing.  Patient is out of town and will not be back until Sunday- advised patient UC for evaluation and follow up with PCP next week- strongly advised she go- not sure she will. Reason for Disposition . [1] Skipped or extra beat(s) AND [2] occurs 4 or more times per minute  Answer Assessment - Initial Assessment Questions 1. DESCRIPTION: "Please describe your heart rate or heartbeat that you are having" (e.g., fast/slow, regular/irregular, skipped or extra beats, "palpitations")     Palpitations- racing- out of sync 2. ONSET: "When did it start?" (Minutes, hours or days)      This week- 3rd day today 3. DURATION: "How long does it last" (e.g., seconds, minutes, hours)     Lasting 20 minutes- 1 hour 4. PATTERN "Does it come and go, or has it been constant since it started?"  "Does it get worse with exertion?"   "Are you feeling it now?"     Comes and goes 5. TAP: "Using your hand, can you tap out what you are feeling on a chair or table in front of you, so that I can hear?" (Note: not all patients can do this)       n/a 6. HEART RATE: "Can you tell me your heart rate?" "How many beats in 15 seconds?"  (Note: not all patients can do this)       19  beats in 15 seconds 7. RECURRENT SYMPTOM: "Have you ever had this before?" If Yes, ask: "When was the last time?" and "What happened that time?"      Yes-patient was referred to cardiologist- wore monitor- 3 years 8. CAUSE: "What do you think is causing the palpitations?"     Not sure 9. CARDIAC HISTORY: "Do you have any history of heart disease?" (e.g., heart attack, angina, bypass surgery, angioplasty, arrhythmia)      no 10. OTHER SYMPTOMS: "Do you have any other symptoms?" (e.g., dizziness, chest pain, sweating, difficulty breathing)       Difficult breath during occurance 11. PREGNANCY: "Is there  any chance you are pregnant?" "When was your last menstrual period?"       n/a  Protocols used: HEART RATE AND HEARTBEAT QUESTIONS-A-AH

## 2020-02-24 NOTE — Telephone Encounter (Signed)
Called to speak to patient to let her know that Dr. Rosanna Randy was booked for Monday if she wanted to see another provider but patient has symptoms of cough and SOB, LVMTCB.

## 2020-02-28 DIAGNOSIS — E782 Mixed hyperlipidemia: Secondary | ICD-10-CM | POA: Diagnosis not present

## 2020-02-28 DIAGNOSIS — R002 Palpitations: Secondary | ICD-10-CM | POA: Diagnosis not present

## 2020-02-28 DIAGNOSIS — R000 Tachycardia, unspecified: Secondary | ICD-10-CM | POA: Diagnosis not present

## 2020-02-28 DIAGNOSIS — R0600 Dyspnea, unspecified: Secondary | ICD-10-CM | POA: Diagnosis not present

## 2020-02-28 DIAGNOSIS — R7309 Other abnormal glucose: Secondary | ICD-10-CM | POA: Diagnosis not present

## 2020-02-28 DIAGNOSIS — R0602 Shortness of breath: Secondary | ICD-10-CM | POA: Diagnosis not present

## 2020-03-05 ENCOUNTER — Ambulatory Visit: Payer: BC Managed Care – PPO | Admitting: Obstetrics and Gynecology

## 2020-03-14 DIAGNOSIS — R000 Tachycardia, unspecified: Secondary | ICD-10-CM | POA: Diagnosis not present

## 2020-03-14 DIAGNOSIS — R0600 Dyspnea, unspecified: Secondary | ICD-10-CM | POA: Diagnosis not present

## 2020-03-14 DIAGNOSIS — R0602 Shortness of breath: Secondary | ICD-10-CM | POA: Diagnosis not present

## 2020-03-14 DIAGNOSIS — R002 Palpitations: Secondary | ICD-10-CM | POA: Diagnosis not present

## 2020-03-26 ENCOUNTER — Encounter: Payer: Self-pay | Admitting: Obstetrics and Gynecology

## 2020-03-26 ENCOUNTER — Ambulatory Visit (INDEPENDENT_AMBULATORY_CARE_PROVIDER_SITE_OTHER): Payer: BC Managed Care – PPO | Admitting: Obstetrics and Gynecology

## 2020-03-26 ENCOUNTER — Telehealth: Payer: Self-pay | Admitting: Obstetrics and Gynecology

## 2020-03-26 ENCOUNTER — Other Ambulatory Visit: Payer: Self-pay

## 2020-03-26 VITALS — BP 122/68 | HR 78 | Ht 62.75 in | Wt 241.0 lb

## 2020-03-26 DIAGNOSIS — Z01419 Encounter for gynecological examination (general) (routine) without abnormal findings: Secondary | ICD-10-CM | POA: Diagnosis not present

## 2020-03-26 DIAGNOSIS — Z124 Encounter for screening for malignant neoplasm of cervix: Secondary | ICD-10-CM | POA: Diagnosis not present

## 2020-03-26 NOTE — Patient Instructions (Signed)

## 2020-03-26 NOTE — Telephone Encounter (Signed)
Patient need appointment for pessary fitting. Sending to triage to assist with scheduling.

## 2020-03-26 NOTE — Telephone Encounter (Signed)
Left message advising OV scheduled for 03/28/20 at 1:30pm w/ Dr. Talbert Nan for pessary fitting.  Return call to office to confirm appt.

## 2020-03-26 NOTE — Progress Notes (Signed)
64 y.o. G3P3 Married White or Caucasian Not Hispanic or Latino female here for annual exam. Patient would like to discuss vaginal prolapse   H/O grade 2-3 cystocele and grade one uterine prolapse and rectocele. H/o mixed incontinence. She does leak with sneezing, more leakage on the way to the bathroom. Leaks a small amount daily if over full on the way to the bathroom.  She has used a pessary in the past #7 ring), but it came out with BM She lost weight and then gained 30 lbs in the last year.  She notices the prolapse intermittently, mostly tolerable.   We have talked about surgery, she is worried about worsening GSI with surgery.   Sexually active without pain, no vaginal bleeding.   She was worked up for palpitations earlier this year. She had a stress test, she may need to have an echo    Patient's last menstrual period was 04/06/2008.          Sexually active: Yes.    The current method of family planning is post menopausal status.    Exercising: Yes.    water aerobics and biking Smoker:  no  Health Maintenance: Pap:  08-27-16 WNL, 08-23-15 WNL NEG HR HPV  History of abnormal Pap:  Yes, about 1998 MMG:  04/20/19 density B Bi-rads 1 neg  BMD:   Never  Colonoscopy: 2020 per patient - normal TDaP:  09/30/16  Gardasil: NA   reports that she has quit smoking. She has never used smokeless tobacco. She reports current alcohol use of about 5.0 - 6.0 standard drinks of alcohol per week. She reports that she does not use drugs.  3 grown kids, all local, has young grand children. She works in [employer], stressful job  Past Medical History:  Diagnosis Date  . Anxiety   . 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 GERD (gastroesophageal reflux disease)   . Kidney stone   . Knee cartilage, torn, right 2003  . 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 Prediabetes     Past Surgical History:  Procedure Laterality Date  . 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 COLPOSCOPY  1998  . hypo thermal ablation  06/02/05  . KNEE SURGERY  07/2004  . TONSILLECTOMY AND ADENOIDECTOMY    . WISDOM TOOTH EXTRACTION      Current Outpatient Medications  Medication Sig Dispense Refill  . Ascorbic Acid (VITAMIN C) 100 MG tablet Take 100 mg by mouth daily.    . betamethasone valerate ointment (VALISONE) 0.1 % Apply a pea sized amount topically BID for up to 2 weeks. Not meant for daily long term use. 30 g 0  . fluticasone (FLONASE) 50 MCG/ACT nasal spray Place 2 sprays into both nostrils daily. 48 g 3  . naproxen (NAPROSYN) 500 MG tablet Take 1 tablet (500 mg total) by mouth 2 (two) times daily with a meal. 20 tablet 2  . omeprazole (PRILOSEC) 20 MG capsule TAKE 1 CAPSULE DAILY 90 capsule 0  . sertraline (ZOLOFT) 100 MG tablet TAKE 1 TABLET BY MOUTH EVERY DAY 90 tablet 3  . Zinc Sulfate (ZINC 15 PO) Take by mouth.    . Biotin 10 MG CAPS Take by mouth daily. (Patient not taking: Reported on 03/26/2020)    . ondansetron (ZOFRAN ODT) 4 MG disintegrating tablet Take 1 tablet (4 mg total) by mouth every 8 (eight) hours as needed. (Patient not taking: Reported on 03/26/2020) 20 tablet  0   No current facility-administered medications for this visit.    Family History  Problem Relation Age of Onset  . Hypertension Mother   . Heart disease Father   . Breast cancer Other   . Osteoporosis Maternal Grandmother   . Ovarian cancer Cousin 70    Review of Systems  Constitutional: Negative.   HENT: Negative.   Eyes: Negative.   Respiratory: Negative.   Cardiovascular: Negative.   Gastrointestinal: Negative.   Endocrine: Negative.   Genitourinary: Negative.   Musculoskeletal: Negative.   Skin: Negative.   Allergic/Immunologic: Negative.   Neurological: Negative.   Hematological: Negative.   Psychiatric/Behavioral: Negative.     Exam:   BP 122/68 (BP Location: Right Arm, Patient Position: Sitting, Cuff Size: Large)   Pulse 78   Ht 5' 2.75" (1.594  m)   Wt 241 lb (109.3 kg)   LMP 04/06/2008   SpO2 98%   BMI 43.03 kg/m   Weight change: @WEIGHTCHANGE @ Height:   Height: 5' 2.75" (159.4 cm)  Ht Readings from Last 3 Encounters:  03/26/20 5' 2.75" (1.594 m)  01/26/19 5\' 3"  (1.6 m)  01/05/18 5\' 3"  (1.6 m)    General appearance: alert, cooperative and appears stated age Head: Normocephalic, without obvious abnormality, atraumatic Neck: no adenopathy, supple, symmetrical, trachea midline and thyroid normal to inspection and palpation Lungs: clear to auscultation bilaterally Cardiovascular: regular rate and rhythm Breasts: normal appearance, no masses or tenderness Abdomen: soft, non-tender; non distended,  no masses,  no organomegaly Extremities: extremities normal, atraumatic, no cyanosis or edema Skin: Skin color, texture, turgor normal. No rashes or lesions Lymph nodes: Cervical, supraclavicular, and axillary nodes normal. No abnormal inguinal nodes palpated Neurologic: Grossly normal   Pelvic: External genitalia:  no lesions              Urethra:  normal appearing urethra with no masses, tenderness or lesions              Bartholins and Skenes: normal                 Vagina: normal appearing vagina with normal color and discharge, no lesions. Grade 2-3 cystocele, grade one uterine prolapse, grade 1-2 rectocele              Cervix: no lesions               Bimanual Exam:  Uterus:  normal size, contour, position, consistency, mobility, non-tender              Adnexa: no mass, fullness, tenderness               Rectovaginal: Confirms               Anus:  normal sphincter tone, no lesions  Terence Lux chaperoned for the exam.  A:  Well Woman with normal exam  Genital prolapse  Mixed incontinence, urge > stress    P:   Pap with hpv  Labs with primary  Return for pessary fitting, will try a cube pessary  We discussed that using the pessary will help [place] determine what will happen with her incontinence with surgery to  correct the cystocele  Discussed breast self exam  Discussed calcium and vit D intake

## 2020-03-27 ENCOUNTER — Other Ambulatory Visit (HOSPITAL_COMMUNITY)
Admission: RE | Admit: 2020-03-27 | Discharge: 2020-03-27 | Disposition: A | Discharge status: Home / Self Care | Payer: BC Managed Care – PPO | Source: Ambulatory Visit | Attending: Obstetrics and Gynecology | Admitting: Obstetrics and Gynecology

## 2020-03-27 DIAGNOSIS — Z124 Encounter for screening for malignant neoplasm of cervix: Secondary | ICD-10-CM | POA: Insufficient documentation

## 2020-03-27 NOTE — Telephone Encounter (Signed)
Spoke with patient, patient request to r/s OV to later date.  OV r/s to 04/24/20 at 1:30pm. Patient declined earlier appts offered.  Patient is agreeable to date and time.   Encounter closed.

## 2020-03-27 NOTE — Addendum Note (Signed)
Addended by: Dorothy Spark on: 03/27/2020 03:26 PM   Modules accepted: Orders

## 2020-03-28 ENCOUNTER — Ambulatory Visit: Payer: BC Managed Care – PPO | Admitting: Obstetrics and Gynecology

## 2020-03-29 LAB — CYTOLOGY - PAP
Adequacy: ABSENT
Comment: NEGATIVE
Diagnosis: NEGATIVE
High risk HPV: NEGATIVE

## 2020-04-09 DIAGNOSIS — R0602 Shortness of breath: Secondary | ICD-10-CM | POA: Diagnosis not present

## 2020-04-09 DIAGNOSIS — R0600 Dyspnea, unspecified: Secondary | ICD-10-CM | POA: Diagnosis not present

## 2020-04-09 DIAGNOSIS — R002 Palpitations: Secondary | ICD-10-CM | POA: Diagnosis not present

## 2020-04-09 DIAGNOSIS — R000 Tachycardia, unspecified: Secondary | ICD-10-CM | POA: Diagnosis not present

## 2020-04-10 ENCOUNTER — Other Ambulatory Visit: Payer: Self-pay | Admitting: Obstetrics and Gynecology

## 2020-04-10 DIAGNOSIS — Z1231 Encounter for screening mammogram for malignant neoplasm of breast: Secondary | ICD-10-CM

## 2020-04-19 ENCOUNTER — Telehealth: Payer: Self-pay

## 2020-04-19 NOTE — Telephone Encounter (Signed)
Patient cancelled pessary fitting due to a work conflict. Patient will call back to reschedule.

## 2020-04-23 DIAGNOSIS — E782 Mixed hyperlipidemia: Secondary | ICD-10-CM | POA: Diagnosis not present

## 2020-04-23 DIAGNOSIS — R0600 Dyspnea, unspecified: Secondary | ICD-10-CM | POA: Diagnosis not present

## 2020-04-23 DIAGNOSIS — R002 Palpitations: Secondary | ICD-10-CM | POA: Diagnosis not present

## 2020-04-24 ENCOUNTER — Ambulatory Visit: Payer: BC Managed Care – PPO | Admitting: Obstetrics and Gynecology

## 2020-05-08 ENCOUNTER — Other Ambulatory Visit: Payer: Self-pay

## 2020-05-08 ENCOUNTER — Ambulatory Visit
Admission: RE | Admit: 2020-05-08 | Discharge: 2020-05-08 | Disposition: A | Discharge status: Home / Self Care | Payer: BC Managed Care – PPO | Source: Ambulatory Visit | Attending: Obstetrics and Gynecology | Admitting: Obstetrics and Gynecology

## 2020-05-08 DIAGNOSIS — Z1231 Encounter for screening mammogram for malignant neoplasm of breast: Secondary | ICD-10-CM | POA: Diagnosis not present

## 2020-05-24 DIAGNOSIS — R0600 Dyspnea, unspecified: Secondary | ICD-10-CM | POA: Diagnosis not present

## 2020-05-24 DIAGNOSIS — Z6841 Body Mass Index (BMI) 40.0 and over, adult: Secondary | ICD-10-CM | POA: Diagnosis not present

## 2020-07-03 ENCOUNTER — Other Ambulatory Visit: Payer: Self-pay | Admitting: Family Medicine

## 2020-07-03 DIAGNOSIS — F419 Anxiety disorder, unspecified: Secondary | ICD-10-CM

## 2020-07-03 NOTE — Telephone Encounter (Signed)
Requested medication (s) are due for refill today: no  Requested medication (s) are on the active medication list:  yes  Last refill:  05/23/2020  Future visit scheduled: no  Notes to clinic:  overdue for office visit Vm left for patient to callback    Requested Prescriptions  Pending Prescriptions Disp Refills   sertraline (ZOLOFT) 100 MG tablet [Pharmacy Med Name: SERTRALINE HCL 100 MG TABLET] 90 tablet 3    Sig: TAKE 1 TABLET BY MOUTH EVERY DAY      Psychiatry:  Antidepressants - SSRI Failed - 07/03/2020 10:13 AM      Failed - Valid encounter within last 6 months    Recent Outpatient Visits           2 years ago Need for zoster vaccination   [facility] Maple Hudson., MD   2 years ago Acute non-recurrent pansinusitis   [facility], Marzella Schlein, MD   2 years ago Need for zoster vaccination   [facility] Maple Hudson., MD   2 years ago Borderline diabetes   [facility] Maple Hudson., MD   3 years ago Anxiety   [facility] Maple Hudson., MD

## 2020-07-11 DIAGNOSIS — L570 Actinic keratosis: Secondary | ICD-10-CM | POA: Diagnosis not present

## 2020-07-11 DIAGNOSIS — D2330 Other benign neoplasm of skin of unspecified part of face: Secondary | ICD-10-CM | POA: Diagnosis not present

## 2020-07-11 DIAGNOSIS — L578 Other skin changes due to chronic exposure to nonionizing radiation: Secondary | ICD-10-CM | POA: Diagnosis not present

## 2020-07-11 DIAGNOSIS — Z872 Personal history of diseases of the skin and subcutaneous tissue: Secondary | ICD-10-CM | POA: Diagnosis not present

## 2020-07-28 DIAGNOSIS — G4733 Obstructive sleep apnea (adult) (pediatric): Secondary | ICD-10-CM | POA: Diagnosis not present

## 2020-08-13 DIAGNOSIS — R0600 Dyspnea, unspecified: Secondary | ICD-10-CM | POA: Diagnosis not present

## 2020-08-13 DIAGNOSIS — Z01818 Encounter for other preprocedural examination: Secondary | ICD-10-CM | POA: Diagnosis not present

## 2020-08-13 DIAGNOSIS — J449 Chronic obstructive pulmonary disease, unspecified: Secondary | ICD-10-CM

## 2020-08-13 HISTORY — DX: Chronic obstructive pulmonary disease, unspecified: J44.9

## 2020-08-20 DIAGNOSIS — Z9989 Dependence on other enabling machines and devices: Secondary | ICD-10-CM | POA: Diagnosis not present

## 2020-08-20 DIAGNOSIS — G4733 Obstructive sleep apnea (adult) (pediatric): Secondary | ICD-10-CM | POA: Diagnosis not present

## 2020-10-08 ENCOUNTER — Other Ambulatory Visit: Payer: Self-pay | Admitting: Family Medicine

## 2020-10-08 DIAGNOSIS — F419 Anxiety disorder, unspecified: Secondary | ICD-10-CM

## 2020-10-08 NOTE — Telephone Encounter (Signed)
Requested medication (s) are due for refill today: no  Requested medication (s) are on the active medication list: yes  Last refill: 05/23/2020  Future visit scheduled:  no  Notes to clinic:  vm left for patient to callback  Overdue for follow up   Requested Prescriptions  Pending Prescriptions Disp Refills   sertraline (ZOLOFT) 100 MG tablet [Pharmacy Med Name: SERTRALINE HCL 100 MG TABLET] 90 tablet 3    Sig: TAKE 1 TABLET BY MOUTH EVERY DAY      Psychiatry:  Antidepressants - SSRI Failed - 10/08/2020  9:46 AM      Failed - Valid encounter within last 6 months    Recent Outpatient Visits           2 years ago Need for zoster vaccination   [facility] Jerrol Banana., MD   2 years ago Acute non-recurrent pansinusitis   [facility], Dionne Bucy, MD   2 years ago Need for zoster vaccination   [facility] Jerrol Banana., MD   2 years ago Borderline diabetes   [facility] Jerrol Banana., MD   3 years ago [place] Jerrol Banana., MD

## 2020-11-02 ENCOUNTER — Other Ambulatory Visit: Payer: Self-pay | Admitting: Family Medicine

## 2020-11-02 DIAGNOSIS — F419 Anxiety disorder, unspecified: Secondary | ICD-10-CM

## 2021-01-02 DIAGNOSIS — B300 Keratoconjunctivitis due to adenovirus: Secondary | ICD-10-CM | POA: Diagnosis not present

## 2021-02-11 ENCOUNTER — Telehealth: Payer: Self-pay

## 2021-02-11 NOTE — Telephone Encounter (Signed)
Copied from [place] [id number]. Topic: General - Other >> Feb 11, 2021  9:22 AM Evelyn Hayes wrote: Reason for CRM: pt called in for assistance. Pt called in to schedule an apt with PCP because she has a vein on the back of her lower leg that she would like to have looked at. Pt says that she really would like to have a referral to [facility] but was told that she has to request from PCP. Pt says that she isn't experiencing any numbness or swelling. Offered pt next opening in the office, (8/23) pt says that it is to far out and would like to be seen sooner if possible.   Please assist pt further.

## 2021-02-12 NOTE — Telephone Encounter (Signed)
Ok to place referral? Or does patient need to be seen? No available appts before then. Please advise. Thanks!

## 2021-02-14 NOTE — Telephone Encounter (Signed)
Appointment scheduled for patient on 02/26/2021.

## 2021-02-26 ENCOUNTER — Encounter: Payer: Self-pay | Admitting: Family Medicine

## 2021-02-26 ENCOUNTER — Ambulatory Visit (INDEPENDENT_AMBULATORY_CARE_PROVIDER_SITE_OTHER): Payer: BC Managed Care – PPO | Admitting: Family Medicine

## 2021-02-26 ENCOUNTER — Other Ambulatory Visit: Payer: Self-pay

## 2021-02-26 VITALS — BP 142/88 | HR 83 | Temp 99.0°F | Resp 16 | Ht 63.0 in | Wt 238.0 lb

## 2021-02-26 DIAGNOSIS — F419 Anxiety disorder, unspecified: Secondary | ICD-10-CM

## 2021-02-26 DIAGNOSIS — R7303 Prediabetes: Secondary | ICD-10-CM

## 2021-02-26 DIAGNOSIS — E7800 Pure hypercholesterolemia, unspecified: Secondary | ICD-10-CM | POA: Diagnosis not present

## 2021-02-26 DIAGNOSIS — I83812 Varicose veins of left lower extremities with pain: Secondary | ICD-10-CM

## 2021-02-26 DIAGNOSIS — K219 Gastro-esophageal reflux disease without esophagitis: Secondary | ICD-10-CM

## 2021-02-26 DIAGNOSIS — Z6839 Body mass index (BMI) 39.0-39.9, adult: Secondary | ICD-10-CM

## 2021-02-26 DIAGNOSIS — E6609 Other obesity due to excess calories: Secondary | ICD-10-CM

## 2021-02-26 DIAGNOSIS — Z79899 Other long term (current) drug therapy: Secondary | ICD-10-CM

## 2021-02-26 NOTE — Patient Instructions (Addendum)
Try not to take omeprazole on Mon, Wed, and Fridays.

## 2021-02-26 NOTE — Progress Notes (Signed)
Established patient visit   [name]   DOB: 1955/09/11   65 y.o. Female  MRN: [id number] Visit Date: 02/26/2021  Today's healthcare provider: Wilhemena Durie, MD   Chief Complaint  Patient presents with   Gastroesophageal Reflux   Anxiety   Leg Pain   Subjective  -------------------------------------------------------------------------------------------------------------------- HPI  Patient comes in today for follow-up.  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  She is married and is a mother of 3 daughters and 4 grandchildren. She has chronic reflux but has not tried to cut back on her PPI.  In the past when she is done so she has had flares of her reflux. She has had recent painful varicose veins in her legs.  There is no leg swelling but the area where the varicose veins are as uncomfortable.  GERD, Follow up:  The patient was last seen for GERD 3 years ago. Changes made since that visit include no medication changes. Continue omeprazole. Requesting refills.   She reports good compliance with treatment. She is not having side effects. Marland Kitchen ----------------------------------------------------------------------------------------- Anxiety, Follow-up  She was last seen for anxiety 3 years ago. Changes made at last visit include no medication changes.   She reports good compliance with treatment. She reports good tolerance of treatment. She is not having side effects.   She feels her anxiety is mild and Unchanged since last visit.  Symptoms: No chest pain No difficulty concentrating  No dizziness No fatigue  No feelings of losing control No insomnia  No irritable No palpitations  No panic attacks No racing thoughts  No shortness of breath No sweating  No tremors/shakes     PHQ-9 Scores PHQ9 SCORE ONLY 02/26/2021 01/19/2017 09/30/2016  PHQ-9 Total Score '7 1 8    '$ Leg pain Patient reports that she has broken blood vessels on her  left leg that are becoming painful. She reports that she has pain going down to her left foot. ---------------------------------------------------------------------------------------------------       Medications: Outpatient Medications Prior to Visit  Medication Sig   Ascorbic Acid (VITAMIN C) 100 MG tablet Take 100 mg by mouth daily.   fluticasone (FLONASE) 50 MCG/ACT nasal spray Place 2 sprays into both nostrils daily.   naproxen (NAPROSYN) 500 MG tablet Take 1 tablet (500 mg total) by mouth 2 (two) times daily with a meal.   omeprazole (PRILOSEC) 20 MG capsule TAKE 1 CAPSULE DAILY   sertraline (ZOLOFT) 100 MG tablet Take 1 tablet (100 mg total) by mouth daily.   Zinc Sulfate (ZINC 15 PO) Take by mouth.   betamethasone valerate ointment (VALISONE) 0.1 % Apply a pea sized amount topically BID for up to 2 weeks. Not meant for daily long term use.   Biotin 10 MG CAPS Take by mouth daily. (Patient not taking: Reported on 03/26/2020)   ondansetron (ZOFRAN ODT) 4 MG disintegrating tablet Take 1 tablet (4 mg total) by mouth every 8 (eight) hours as needed. (Patient not taking: Reported on 03/26/2020)   No facility-administered medications prior to visit.    Review of Systems  Constitutional:  Negative for activity change and fatigue.  Respiratory:  Negative for cough and shortness of breath.   Cardiovascular:  Negative for chest pain, palpitations and leg swelling.  Gastrointestinal:  Negative for abdominal pain, constipation, diarrhea and nausea.  Neurological:  Negative for dizziness, light-headedness and headaches.  Psychiatric/Behavioral:  Negative for agitation, decreased concentration, self-injury, sleep disturbance and suicidal ideas. The patient  is not nervous/anxious.        Objective  -------------------------------------------------------------------------------------------------------------------- BP (!) 142/88   Pulse 83   Temp 99 F (37.2 C)   Resp 16   Ht '5\' 3"'$  (1.6 m)    Wt 238 lb (108 kg)   LMP 04/06/2008   BMI 42.16 kg/m  BP Readings from Last 3 Encounters:  02/26/21 (!) 142/88  03/26/20 122/68  09/12/19 137/71   Wt Readings from Last 3 Encounters:  02/26/21 238 lb (108 kg)  03/26/20 241 lb (109.3 kg)  09/12/19 220 lb 0.3 oz (99.8 kg)       Physical Exam Vitals reviewed.  Constitutional:      Appearance: She is well-developed.  HENT:     Head: Normocephalic and atraumatic.     Right Ear: External ear normal.     Left Ear: External ear normal.     Nose: Nose normal.  Eyes:     General: No scleral icterus.    Conjunctiva/sclera: Conjunctivae normal.  Neck:     Thyroid: No thyromegaly.  Cardiovascular:     Rate and Rhythm: Normal rate and regular rhythm.     Heart sounds: Normal heart sounds.     Comments: Small varicose veins of the leg that are minimally tender but not engorged Pulmonary:     Effort: Pulmonary effort is normal.     Breath sounds: Normal breath sounds.  Abdominal:     Palpations: Abdomen is soft.  Skin:    General: Skin is warm and dry.  Neurological:     Mental Status: She is alert and oriented to person, place, and time.  Psychiatric:        Behavior: Behavior normal.        Thought Content: Thought content normal.        Judgment: Judgment normal.      No results found for any visits on 02/26/21.  Assessment & Plan  ---------------------------------------------------------------------------------------------------------------------- 1. Hypercholesteremia  - CBC with Differential/Platelet - Comprehensive metabolic panel - TSH - Lipid panel  2. Borderline diabetes Follow-up A1c - Hemoglobin A1c  3. Gastroesophageal reflux disease, unspecified whether esophagitis present Try to not take PPI on Monday Wednesday Friday - B12 and Folate Panel  4. Anxiety Chronic and stable.  Hopefully better as she retired from work tomorrow  65. Varicose veins of left lower extremity with pain Refer to  vascular. - Ambulatory referral to Vascular Surgery  6. High risk medication use On daily PPI.  Obtain lab work including B12 and folate  7. Class 2 obesity due to excess calories without serious comorbidity with body mass index (BMI) of 39.0 to 39.9 in adult Diet and exercise stressed that she retires.  Her blood pressure is up today.  Follow-up in a few months.   No follow-ups on file.      I, Wilhemena Durie, MD, have reviewed all documentation for this visit. The documentation on 03/04/21 for the exam, diagnosis, procedures, and orders are all accurate and complete.    Michel Eskelson Cranford Mon, MD  [facility] [phone] (phone) [phone] (fax)  [place]

## 2021-02-27 DIAGNOSIS — K219 Gastro-esophageal reflux disease without esophagitis: Secondary | ICD-10-CM | POA: Diagnosis not present

## 2021-02-27 DIAGNOSIS — R7303 Prediabetes: Secondary | ICD-10-CM | POA: Diagnosis not present

## 2021-02-27 DIAGNOSIS — E7800 Pure hypercholesterolemia, unspecified: Secondary | ICD-10-CM | POA: Diagnosis not present

## 2021-02-28 LAB — COMPREHENSIVE METABOLIC PANEL
ALT: 65 IU/L — ABNORMAL HIGH (ref 0–32)
AST: 48 IU/L — ABNORMAL HIGH (ref 0–40)
Albumin/Globulin Ratio: 1.8 (ref 1.2–2.2)
Albumin: 4.6 g/dL (ref 3.8–4.8)
Alkaline Phosphatase: 78 IU/L (ref 44–121)
BUN/Creatinine Ratio: 16 (ref 12–28)
BUN: 12 mg/dL (ref 8–27)
Bilirubin Total: 0.5 mg/dL (ref 0.0–1.2)
CO2: 20 mmol/L (ref 20–29)
Calcium: 9.7 mg/dL (ref 8.7–10.3)
Chloride: 101 mmol/L (ref 96–106)
Creatinine, Ser: 0.77 mg/dL (ref 0.57–1.00)
Globulin, Total: 2.5 g/dL (ref 1.5–4.5)
Glucose: 107 mg/dL — ABNORMAL HIGH (ref 65–99)
Potassium: 4.6 mmol/L (ref 3.5–5.2)
Sodium: 138 mmol/L (ref 134–144)
Total Protein: 7.1 g/dL (ref 6.0–8.5)
eGFR: 87 mL/min/{1.73_m2} (ref >59)

## 2021-02-28 LAB — CBC WITH DIFFERENTIAL/PLATELET
Basophils Absolute: 0 10*3/uL (ref 0.0–0.2)
Basos: 1 %
EOS (ABSOLUTE): 0.1 10*3/uL (ref 0.0–0.4)
Eos: 2 %
Hematocrit: 40.8 % (ref 34.0–46.6)
Hemoglobin: 13.8 g/dL (ref 11.1–15.9)
Immature Grans (Abs): 0 10*3/uL (ref 0.0–0.1)
Immature Granulocytes: 0 %
Lymphocytes Absolute: 1.9 10*3/uL (ref 0.7–3.1)
Lymphs: 31 %
MCH: 28.5 pg (ref 26.6–33.0)
MCHC: 33.8 g/dL (ref 31.5–35.7)
MCV: 84 fL (ref 79–97)
Monocytes Absolute: 0.4 10*3/uL (ref 0.1–0.9)
Monocytes: 7 %
Neutrophils Absolute: 3.8 10*3/uL (ref 1.4–7.0)
Neutrophils: 59 %
Platelets: 220 10*3/uL (ref 150–450)
RBC: 4.84 x10E6/uL (ref 3.77–5.28)
RDW: 12.4 % (ref 11.7–15.4)
WBC: 6.3 10*3/uL (ref 3.4–10.8)

## 2021-02-28 LAB — B12 AND FOLATE PANEL
Folate: 7.3 ng/mL (ref >3.0)
Vitamin B-12: 489 pg/mL (ref 232–1245)

## 2021-02-28 LAB — LIPID PANEL
Chol/HDL Ratio: 4.1 ratio (ref 0.0–4.4)
Cholesterol, Total: 214 mg/dL — ABNORMAL HIGH (ref 100–199)
HDL: 52 mg/dL (ref >39)
LDL Chol Calc (NIH): 144 mg/dL — ABNORMAL HIGH (ref 0–99)
Triglycerides: 98 mg/dL (ref 0–149)
VLDL Cholesterol Cal: 18 mg/dL (ref 5–40)

## 2021-02-28 LAB — TSH: TSH: 1.88 u[IU]/mL (ref 0.450–4.500)

## 2021-02-28 LAB — HEMOGLOBIN A1C
Est. average glucose Bld gHb Est-mCnc: 134 mg/dL
Hgb A1c MFr Bld: 6.3 % — ABNORMAL HIGH (ref 4.8–5.6)

## 2021-03-04 ENCOUNTER — Other Ambulatory Visit: Payer: Self-pay | Admitting: Family Medicine

## 2021-03-04 ENCOUNTER — Ambulatory Visit: Payer: BLUE CROSS/BLUE SHIELD | Admitting: Family Medicine

## 2021-03-04 DIAGNOSIS — F419 Anxiety disorder, unspecified: Secondary | ICD-10-CM

## 2021-03-04 DIAGNOSIS — K219 Gastro-esophageal reflux disease without esophagitis: Secondary | ICD-10-CM

## 2021-03-04 MED ORDER — SERTRALINE HCL 100 MG PO TABS: 100.0000 mg | ORAL_TABLET | Freq: Every day | ORAL | 0 refills | Status: DC

## 2021-03-04 NOTE — Telephone Encounter (Signed)
Requested medication (s) are due for refill today: yes  Requested medication (s) are on the active medication list: yes  Last refill:  08/08/19  Future visit scheduled: yes  Notes to clinic:  Noted at last OV :Try not to take omeprazole on Mon, Wed, and Fridays   Requested Prescriptions  Pending Prescriptions Disp Refills   omeprazole (PRILOSEC) 20 MG capsule 90 capsule 0    Sig: Take 1 capsule (20 mg total) by mouth daily.     Gastroenterology: Proton Pump Inhibitors Passed - 03/04/2021 12:28 PM      Passed - Valid encounter within last 12 months    Recent Outpatient Visits           6 days ago [place] Jerrol Banana., MD   2 years ago Need for zoster vaccination   [facility] Jerrol Banana., MD   2 years ago Acute non-recurrent pansinusitis   [facility], Dionne Bucy, MD   3 years ago Need for zoster vaccination   [facility] Jerrol Banana., MD   3 years ago Borderline diabetes   [facility] Jerrol Banana., MD       Future Appointments             In 5 months Jerrol Banana., MD [facility]            Signed Prescriptions Disp Refills   sertraline (ZOLOFT) 100 MG tablet 90 tablet 0    Sig: Take 1 tablet (100 mg total) by mouth daily.     Psychiatry:  Antidepressants - SSRI Passed - 03/04/2021 12:28 PM      Passed - Valid encounter within last 6 months    Recent Outpatient Visits           6 days ago [place] Jerrol Banana., MD   2 years ago Need for zoster vaccination   [facility] Jerrol Banana., MD   2 years ago Acute non-recurrent pansinusitis   [facility], Dionne Bucy, MD   3 years ago Need for zoster vaccination   [facility] Jerrol Banana., MD   3 years ago Borderline  diabetes   [facility] Jerrol Banana., MD       Future Appointments             In 5 months Jerrol Banana., MD [facility] [place]

## 2021-03-04 NOTE — Telephone Encounter (Signed)
Medication Refill - Medication: Generic Zoloft 100 mg,  Omeprozole 20 mg  90 day supply  Has the patient contacted their pharmacy? Yes.  They told her to call the office (Agent: If no, request that the patient contact the pharmacy for the refill.) (Agent: If yes, when and what did the pharmacy advise?)  Preferred Pharmacy (with phone number or street name): [facility] [address]  Agent: Please be advised that RX refills may take up to 3 business days. We ask that you follow-up with your pharmacy.

## 2021-03-07 ENCOUNTER — Other Ambulatory Visit: Payer: Self-pay | Admitting: Family Medicine

## 2021-03-07 DIAGNOSIS — K219 Gastro-esophageal reflux disease without esophagitis: Secondary | ICD-10-CM

## 2021-03-07 MED ORDER — OMEPRAZOLE 20 MG PO CPDR: 20.0000 mg | DELAYED_RELEASE_CAPSULE | Freq: Every day | ORAL | 0 refills | Status: DC

## 2021-03-07 MED ORDER — SERTRALINE HCL 100 MG PO TABS: 100.0000 mg | ORAL_TABLET | Freq: Every day | ORAL | 0 refills | Status: DC

## 2021-03-12 MED ORDER — OMEPRAZOLE 20 MG PO CPDR: 20.0000 mg | DELAYED_RELEASE_CAPSULE | Freq: Every day | ORAL | 1 refills | Status: DC

## 2021-03-19 ENCOUNTER — Other Ambulatory Visit: Payer: Self-pay

## 2021-03-19 ENCOUNTER — Ambulatory Visit (INDEPENDENT_AMBULATORY_CARE_PROVIDER_SITE_OTHER): Payer: BC Managed Care – PPO | Admitting: Vascular Surgery

## 2021-03-19 VITALS — BP 144/86 | HR 99 | Ht 63.0 in | Wt 230.0 lb

## 2021-03-19 DIAGNOSIS — I83812 Varicose veins of left lower extremities with pain: Secondary | ICD-10-CM | POA: Diagnosis not present

## 2021-03-19 DIAGNOSIS — R7303 Prediabetes: Secondary | ICD-10-CM | POA: Diagnosis not present

## 2021-03-19 DIAGNOSIS — I8002 Phlebitis and thrombophlebitis of superficial vessels of left lower extremity: Secondary | ICD-10-CM | POA: Diagnosis not present

## 2021-03-19 NOTE — Progress Notes (Signed)
Patient ID: Evelyn Hayes, female   DOB: 09-Oct-1955, 65 y.o.   MRN: [id number]  Chief Complaint  Patient presents with   New Patient (Initial Visit)    NP consult VV with LLE pain referred by Julieanne Manson    HPI Evelyn Hayes is a 65 y.o. female.  I am asked to see the patient by Dr. Sullivan Lone for evaluation of painful varicose veins with what sounds like an episode of superficial thrombophlebitis several weeks ago.  This was on the left leg which is the predominantly affected leg.  The patient presents with complaints of symptomatic varicosities of the left leg that have been present for many years. The patient reports a long standing history of varicosities and they have become painful over time. There was no clear inciting event or causative factor that started the symptoms.  The left leg is more severly affected. The patient elevates the legs for relief. The pain is described as stinging and burning and her episode of superficial thrombophlebitis was very tender to the touch and the area was hard and red. The symptoms are generally most severe in the evening, particularly when they have been on their feet for long periods of time.  Elevation and anti-inflammatory medication has been used to try to improve the symptoms with some success. The patient complains of some swelling as an associated symptom. The patient has no previous history of deep venous thrombosis or superficial thrombophlebitis to their knowledge.     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   COLPOSCOPY  1998   hypo thermal ablation  06/02/05   KNEE SURGERY  07/2004    TONSILLECTOMY AND ADENOIDECTOMY     WISDOM TOOTH EXTRACTION      Family History  Problem Relation Age of Onset   Hypertension Mother    Heart disease Father    Breast cancer Other    Osteoporosis Maternal Grandmother    Ovarian cancer Cousin 84      Social History   Tobacco Use   Smoking status: Former   Smokeless tobacco: Never   Tobacco comments:    smoked for about 2 years in high school  Vaping Use   Vaping Use: Never used  Substance Use Topics   Alcohol use: Yes    Alcohol/week: 5.0 - 6.0 standard drinks    Types: 5 - 6 Standard drinks or equivalent per week   Drug use: No     No Known Allergies  Current Outpatient Medications  Medication Sig Dispense Refill   Ascorbic Acid (VITAMIN C) 100 MG tablet Take 100 mg by mouth daily.     fluticasone (FLONASE) 50 MCG/ACT nasal spray Place 2 sprays into both nostrils daily. 48 g 3   naproxen (NAPROSYN) 500 MG tablet Take 1 tablet (500 mg total) by mouth 2 (two) times daily with a meal. 20 tablet 2   omeprazole (PRILOSEC OTC) 20 MG tablet Take by mouth.     omeprazole (PRILOSEC) 20 MG capsule Take 1 capsule (20 mg total) by mouth daily. 90 capsule 1   sertraline (ZOLOFT) 100 MG tablet Take  1 tablet (100 mg total) by mouth daily. 90 tablet 0   Zinc Sulfate (ZINC 15 PO) Take by mouth.     betamethasone valerate ointment (VALISONE) 0.1 % Apply a pea sized amount topically BID for up to 2 weeks. Not meant for daily long term use. 30 g 0   Biotin 10 MG CAPS Take by mouth daily. (Patient not taking: Reported on 03/26/2020)     ondansetron (ZOFRAN ODT) 4 MG disintegrating tablet Take 1 tablet (4 mg total) by mouth every 8 (eight) hours as needed. (Patient not taking: Reported on 03/26/2020) 20 tablet 0   No current facility-administered medications for this visit.      REVIEW OF SYSTEMS (Negative unless checked)  Constitutional: [] Weight loss  [] Fever  [] Chills Cardiac: [] Chest pain   [] Chest pressure   [] Palpitations    [] Shortness of breath when laying flat   [] Shortness of breath at rest   [] Shortness of breath with exertion. Vascular:  [] Pain in legs with walking   [] Pain in legs at rest   [] Pain in legs when laying flat   [] Claudication   [] Pain in feet when walking  [] Pain in feet at rest  [] Pain in feet when laying flat   [] History of DVT   [x] Phlebitis   [x] Swelling in legs   [x] Varicose veins   [] Non-healing ulcers Pulmonary:   [] Uses home oxygen   [] Productive cough   [] Hemoptysis   [] Wheeze  [] COPD   [] Asthma Neurologic:  [] Dizziness  [] Blackouts   [] Seizures   [] History of stroke   [] History of TIA  [] Aphasia   [] Temporary blindness   [] Dysphagia   [] Weakness or numbness in arms   [] Weakness or numbness in legs Musculoskeletal:  [x] Arthritis   [] Joint swelling   [] Joint pain   [] Low back pain Hematologic:  [] Easy bruising  [] Easy bleeding   [] Hypercoagulable state   [] Anemic  [] Hepatitis Gastrointestinal:  [] Blood in stool   [] Vomiting blood  [x] Gastroesophageal reflux/heartburn   [] Abdominal pain Genitourinary:  [] Chronic kidney disease   [] Difficult urination  [] Frequent urination  [] Burning with urination   [] Hematuria Skin:  [] Rashes   [] Ulcers   [] Wounds Psychological:  [x] History of anxiety   []  History of major depression.    Physical Exam BP (!) 144/86   Pulse 99   Ht 5\' 3"  (1.6 m)   Wt 230 lb (104.3 kg)   LMP 04/06/2008   BMI 40.74 kg/m  Gen:  WD/WN, NAD. Appears younger than stated age. Head: [place]/AT, No temporalis wasting.  Ear/Nose/Throat: Hearing grossly intact, dentition good Eyes: Sclera non-icteric. Conjunctiva clear Neck: Supple. Trachea midline Pulmonary:  Good air movement, no use of accessory muscles, respirations not labored.  Cardiac: RRR, No JVD Vascular: Varicosities scattered and measuring up to 1-2 mm in the right lower extremity        Varicosities diffuse and measuring up to 2 mm in the left lower extremity Vessel Right Left  Radial Palpable Palpable                           PT Palpable Palpable  DP Palpable Palpable   Gastrointestinal: soft, non-tender/non-distended.  Musculoskeletal: M/S 5/5 throughout.   No RLE edema.  trace LLE edema Neurologic: Sensation grossly intact in extremities.  Symmetrical.  Speech is fluent.  Psychiatric: Judgment intact, Mood & affect appropriate for pt's clinical situation. Dermatologic: No rashes or ulcers noted.  No cellulitis or open wounds.    Radiology No results found.  Labs Recent Results (from the past 2160 hour(s))  CBC with Differential/Platelet     Status: None   Collection Time: 02/27/21 10:54 AM  Result Value Ref Range   WBC 6.3 3.4 - 10.8 x10E3/uL   RBC 4.84 3.77 - 5.28 x10E6/uL   Hemoglobin 13.8 11.1 - 15.9 g/dL   Hematocrit 40.8 34.0 - 46.6 %   MCV 84 79 - 97 fL   MCH 28.5 26.6 - 33.0 pg   MCHC 33.8 31.5 - 35.7 g/dL   RDW 12.4 11.7 - 15.4 %   Platelets 220 150 - 450 x10E3/uL   Neutrophils 59 Not Estab. %   Lymphs 31 Not Estab. %   Monocytes 7 Not Estab. %   Eos 2 Not Estab. %   Basos 1 Not Estab. %   Neutrophils Absolute 3.8 1.4 - 7.0 x10E3/uL   Lymphocytes Absolute 1.9 0.7 - 3.1 x10E3/uL   Monocytes Absolute 0.4 0.1 - 0.9 x10E3/uL   EOS (ABSOLUTE) 0.1 0.0 - 0.4 x10E3/uL   Basophils Absolute 0.0 0.0 - 0.2 x10E3/uL   Immature Granulocytes 0 Not Estab. %   Immature Grans (Abs) 0.0 0.0 - 0.1 x10E3/uL  Comprehensive metabolic panel     Status: Abnormal   Collection Time: 02/27/21 10:54 AM  Result Value Ref Range   Glucose 107 (H) 65 - 99 mg/dL   BUN 12 8 - 27 mg/dL   Creatinine, Ser 0.77 0.57 - 1.00 mg/dL   eGFR 87 >59 mL/min/1.73   BUN/Creatinine Ratio 16 12 - 28   Sodium 138 134 - 144 mmol/L   Potassium 4.6 3.5 - 5.2 mmol/L   Chloride 101 96 - 106 mmol/L   CO2 20 20 - 29 mmol/L   Calcium 9.7 8.7 - 10.3 mg/dL   Total Protein 7.1 6.0 - 8.5 g/dL   Albumin 4.6 3.8 - 4.8 g/dL   Globulin, Total 2.5 1.5 - 4.5 g/dL   Albumin/Globulin Ratio 1.8 1.2 - 2.2   Bilirubin Total 0.5  0.0 - 1.2 mg/dL   Alkaline Phosphatase 78 44 - 121 IU/L   AST 48 (H) 0 - 40 IU/L   ALT 65 (H) 0 - 32 IU/L  Hemoglobin A1c     Status: Abnormal   Collection Time: 02/27/21 10:54 AM  Result Value Ref Range   Hgb A1c MFr Bld 6.3 (H) 4.8 - 5.6 %    Comment:          Prediabetes: 5.7 - 6.4          Diabetes: >6.4          Glycemic control for adults with diabetes: <7.0    Est. average glucose Bld gHb Est-mCnc 134 mg/dL  TSH     Status: None   Collection Time: 02/27/21 10:54 AM  Result Value Ref Range   TSH 1.880 0.450 - 4.500 uIU/mL  B12 and Folate Panel     Status: None   Collection Time: 02/27/21 10:54 AM  Result Value Ref Range   Vitamin B-12 489 232 - 1,245 pg/mL   Folate 7.3 >3.0 ng/mL    Comment: A serum folate concentration of less than 3.1 ng/mL is considered to represent clinical deficiency.   Lipid panel     Status: Abnormal   Collection Time: 02/27/21 10:54 AM  Result Value Ref Range   Cholesterol, Total 214 (H) 100 - 199 mg/dL   Triglycerides 98 0 - 149 mg/dL   HDL 52 >39 mg/dL   VLDL Cholesterol Cal 18 5 -  40 mg/dL   LDL Chol Calc (NIH) 144 (H) 0 - 99 mg/dL   Chol/HDL Ratio 4.1 0.0 - 4.4 ratio    Comment:                                   T. Chol/HDL Ratio                                             Men  Women                               1/2 Avg.Risk  3.4    3.3                                   Avg.Risk  5.0    4.4                                2X Avg.Risk  9.6    7.1                                3X Avg.Risk 23.4   11.0     Assessment/Plan:  Borderline diabetes blood glucose control important in reducing the progression of atherosclerotic disease. Also, involved in wound healing. On appropriate medications.   Superficial thrombophlebitis Patient had an episode of what sounds like clear superficial thrombophlebitis of the left leg several weeks ago.  This seems to have resolved but she has prominent residual varicosities which we are working up for  definitive treatment.  We discussed that warm compresses, anti-inflammatories, and aspirin are generally appropriate treatment for superficial thrombophlebitis.  Varicose veins of leg with pain, left   The patient has symptoms consistent with chronic venous insufficiency. We discussed the natural history and treatment options for venous disease. I recommended the regular use of 20 - 30 mm Hg compression stockings, and prescribed these today. I recommended leg elevation and anti-inflammatories as needed for pain. I have also recommended a complete venous duplex to assess the venous system for reflux or thrombotic issues. This can be done at the patient's convenience. I will see the patient back after the duplex to assess the response to conservative management, and determine further treatment options.     Leotis Pain 03/20/2021, 10:59 AM   This note was created with Dragon medical transcription system.  Any errors from dictation are unintentional.

## 2021-03-20 DIAGNOSIS — I809 Phlebitis and thrombophlebitis of unspecified site: Secondary | ICD-10-CM | POA: Insufficient documentation

## 2021-03-20 DIAGNOSIS — I83812 Varicose veins of left lower extremities with pain: Secondary | ICD-10-CM | POA: Insufficient documentation

## 2021-03-20 NOTE — Patient Instructions (Signed)
Varicose Veins Varicose veins are veins that have become enlarged, bulged, and twisted. They most often appear in the legs. What are the causes? This condition is caused by damage to the valves in the vein. These valves help blood return to your heart. When they are damaged and they stop working properly, blood may flow backward and back up in the veins near the skin, causing the veins to get larger and appear twisted. The condition can result from any issue that causes blood to back up, like pregnancy, prolonged standing, or obesity. What increases the risk? This condition is more likely to develop in people who are: On their feet a lot. Pregnant. Overweight. What are the signs or symptoms? Symptoms of this condition include: Bulging, twisted, and bluish veins. A feeling of heaviness. This may be worse at the end of the day. Leg pain. This may be worse at the end of the day. Swelling in the leg. Changes in skin color over the veins. How is this diagnosed? This condition may be diagnosed based on your symptoms, a physical exam, and an ultrasound test. How is this treated? Treatment for this condition may involve: Avoiding sitting or standing in one position for long periods of time. Wearing compression stockings. These stockings help to prevent blood clots and reduce swelling in the legs. Raising (elevating) the legs when resting. Losing weight. Exercising regularly. If you have persistent symptoms or want to improve the way your varicose veins look, you may choose to have a procedure to close the varicose veins off or to remove them. Treatments to close off the veins include: Sclerotherapy. In this treatment, a solution is injected into a vein to close it off. Laser treatment. In this treatment, the vein is heated with a laser to close it off. Radiofrequency vein ablation. In this treatment, an electrical current produced by radio waves is used to close off the vein. Treatments to  remove the veins include: Phlebectomy. In this treatment, the veins are removed through small incisions made over the veins. Vein ligation and stripping. In this treatment, incisions are made over the veins. The veins are then removed after being tied (ligated) with stitches (sutures). Follow these instructions at home: Activity Walk as much as possible. Walking increases blood flow. This helps blood return to the heart and takes pressure off your veins. It also increases your cardiovascular strength. Follow your health care provider's instructions about exercising. Do not stand or sit in one position for a long period of time. Do not sit with your legs crossed. Rest with your legs raised during the day. General instructions  Follow any diet instructions given to you by your health care provider. Wear compression stockings as directed by your health care provider. Do not wear other kinds of tight clothing around your legs, pelvis, or waist. Elevate your legs at night to above the level of your heart. If you get a cut in the skin over the varicose vein and the vein bleeds: Lie down with your leg raised. Apply firm pressure to the cut with a clean cloth until the bleeding stops. Place a bandage (dressing) on the cut. Contact a health care provider if: The skin around your varicose veins starts to break down. You have pain, redness, tenderness, or hard swelling over a vein. You are uncomfortable because of pain. You get a cut in the skin over a varicose vein and it will not stop bleeding. Summary Varicose veins are veins that have become enlarged, bulged, and  twisted. They most often appear in the legs. This condition is caused by damage to the valves in the vein. These valves help blood return to your heart. Treatment for this condition includes frequent movements, wearing compression stockings, losing weight, and exercising regularly. In some cases, procedures are done to close off or  remove the veins. Treatment for this condition may include wearing compression stockings, elevating the legs, losing weight, and engaging in regular activity. In some cases, procedures are done to close off or remove the veins. This information is not intended to replace advice given to you by your health care provider. Make sure you discuss any questions you have with your health care provider. Document Revised: 11/03/2019 Document Reviewed: 11/03/2019 Elsevier Patient Education  [place].

## 2021-03-20 NOTE — Assessment & Plan Note (Signed)
blood glucose control important in reducing the progression of atherosclerotic disease. Also, involved in wound healing. On appropriate medications.  

## 2021-03-20 NOTE — Assessment & Plan Note (Signed)
Patient had an episode of what sounds like clear superficial thrombophlebitis of the left leg several weeks ago.  This seems to have resolved but she has prominent residual varicosities which we are working up for definitive treatment.  We discussed that warm compresses, anti-inflammatories, and aspirin are generally appropriate treatment for superficial thrombophlebitis.

## 2021-04-03 ENCOUNTER — Ambulatory Visit: Payer: BC Managed Care – PPO | Admitting: Obstetrics and Gynecology

## 2021-04-09 ENCOUNTER — Encounter (INDEPENDENT_AMBULATORY_CARE_PROVIDER_SITE_OTHER): Payer: Self-pay | Admitting: Nurse Practitioner

## 2021-04-09 ENCOUNTER — Other Ambulatory Visit: Payer: Self-pay

## 2021-04-09 ENCOUNTER — Ambulatory Visit (INDEPENDENT_AMBULATORY_CARE_PROVIDER_SITE_OTHER): Payer: BC Managed Care – PPO | Admitting: Nurse Practitioner

## 2021-04-09 ENCOUNTER — Ambulatory Visit (INDEPENDENT_AMBULATORY_CARE_PROVIDER_SITE_OTHER): Payer: BC Managed Care – PPO

## 2021-04-09 VITALS — BP 150/74 | HR 77 | Resp 16 | Wt 229.0 lb

## 2021-04-09 DIAGNOSIS — E7800 Pure hypercholesterolemia, unspecified: Secondary | ICD-10-CM | POA: Diagnosis not present

## 2021-04-09 DIAGNOSIS — I8002 Phlebitis and thrombophlebitis of superficial vessels of left lower extremity: Secondary | ICD-10-CM | POA: Diagnosis not present

## 2021-04-09 DIAGNOSIS — I83812 Varicose veins of left lower extremities with pain: Secondary | ICD-10-CM

## 2021-04-11 DIAGNOSIS — H2513 Age-related nuclear cataract, bilateral: Secondary | ICD-10-CM | POA: Diagnosis not present

## 2021-04-15 ENCOUNTER — Encounter (INDEPENDENT_AMBULATORY_CARE_PROVIDER_SITE_OTHER): Payer: Self-pay | Admitting: Nurse Practitioner

## 2021-04-15 NOTE — Progress Notes (Signed)
Subjective:    Patient ID: Evelyn Hayes, female    DOB: [birth year], 65 y.o.   MRN: [id number] Chief Complaint  Patient presents with   Follow-up    Ultrasound follow up    Evelyn Hayes is a 65 y.o. female.  That presents today to follow-up evaluation of painful varicose veins with what previously sounded like superficial thrombophlebitis.  The thrombophlebitis seems to have resolved mostly at this time.  The patient has complaints of symptomatic varicosities in the left lower extremity that been present for numerous years.  She notes that her symptoms tend to be more severe in the evening.  Elevation and anti-inflammatories have helped somewhat.  The patient has not worn compression socks consistently.  She denies any fever chills or open wounds.  Today noninvasive studies show evidence of reflux in the left great saphenous vein at the saphenofemoral junction extending to the mid thigh.  There is no evidence of DVT or superficial thrombophlebitis in the left lower extremity.  No evidence of deep venous insufficiency.    Review of Systems  Cardiovascular:  Positive for leg swelling.  All other systems reviewed and are negative.     Objective:   Physical Exam Vitals reviewed.  HENT:     Head: Normocephalic.  Cardiovascular:     Rate and Rhythm: Normal rate.     Pulses: Normal pulses.  Pulmonary:     Effort: Pulmonary effort is normal.  Musculoskeletal:        General: Tenderness present.  Skin:    General: Skin is warm and dry.  Neurological:     Mental Status: She is alert.  Psychiatric:        Mood and Affect: Mood normal.        Behavior: Behavior normal.        Thought Content: Thought content normal.        Judgment: Judgment normal.    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   Prediabetes     Social History   Socioeconomic History   Marital status: Married    Spouse name: Not on file   Number of children: Not on file   Years of education: Not on file   Highest education level: Not on file  Occupational History   Not on file  Tobacco Use   Smoking status: Former   Smokeless tobacco: Never   Tobacco comments:    smoked for about 2 years in high school  Vaping Use   Vaping Use: Never used  Substance and Sexual Activity   Alcohol use: Yes    Alcohol/week: 5.0 - 6.0 standard drinks    Types: 5 - 6 Standard drinks or equivalent per week   Drug use: No   Sexual activity: Yes    Partners: Male    Birth control/protection: Post-menopausal  Other Topics Concern   Not on file  Social History Narrative   Not on file   Social Determinants of Health   Financial Resource Strain: Not on file  Food Insecurity: Not on file  Transportation Needs: Not on  file  Physical Activity: Not on file  Stress: Not on file  Social Connections: Not on file  Intimate Partner Violence: Not on file    Past Surgical History:  Procedure Laterality Date   CERVICAL BIOPSY  W/ 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   COLPOSCOPY  1998   hypo thermal ablation  06/02/05   KNEE SURGERY  07/2004   TONSILLECTOMY AND ADENOIDECTOMY     WISDOM TOOTH EXTRACTION      Family History  Problem Relation Age of Onset   Hypertension Mother    Heart disease Father    Breast cancer Other    Osteoporosis Maternal Grandmother    Ovarian cancer Cousin 66    No Known Allergies  CBC Latest Ref Rng & Units 02/27/2021 09/12/2019 01/26/2019  WBC 3.4 - 10.8 x10E3/uL 6.3 6.7 7.4  Hemoglobin 11.1 - 15.9 g/dL 13.8 12.5 14.2  Hematocrit 34.0 - 46.6 % 40.8 37.8 44.0  Platelets 150 - 450 x10E3/uL 220 205 238      CMP     Component Value  Date/Time   NA 138 02/27/2021 1054   K 4.6 02/27/2021 1054   CL 101 02/27/2021 1054   CO2 20 02/27/2021 1054   GLUCOSE 107 (H) 02/27/2021 1054   GLUCOSE 151 (H) 09/12/2019 0627   BUN 12 02/27/2021 1054   CREATININE 0.77 02/27/2021 1054   CALCIUM 9.7 02/27/2021 1054   PROT 7.1 02/27/2021 1054   ALBUMIN 4.6 02/27/2021 1054   AST 48 (H) 02/27/2021 1054   ALT 65 (H) 02/27/2021 1054   ALKPHOS 78 02/27/2021 1054   BILITOT 0.5 02/27/2021 1054   GFRNONAA >60 09/12/2019 0627   GFRAA >60 09/12/2019 0627     No results found.     Assessment & Plan:   1. Thrombophlebitis of superficial veins of left lower extremity This has essentially resolved but the patient continues to have prominent varicosities.  2. Varicose veins of leg with pain, left Recommend:  The patient is complaining of varicose veins.    I have had a long discussion with the patient regarding  varicose veins and why they cause symptoms.  Patient will begin wearing graduated compression stockings on a daily basis, beginning first thing in the morning and removing them in the evening. The patient is instructed specifically not to sleep in the stockings.    The patient  will also begin using over-the-counter analgesics such as Motrin 600 mg po TID to help control the symptoms as needed.    In addition, behavioral modification including elevation during the day will be initiated, utilizing a recliner was recommended.  The patient is also instructed to continue exercising such as walking 4-5 times per week.  At this time the patient wishes to continue conservative therapy and is not interested in more invasive treatments such as laser ablation and sclerotherapy.  The Patient will follow up PRN if the symptoms worsen.   3. Hypercholesteremia Continue statin as ordered and reviewed, no changes at this time    Current Outpatient Medications on File Prior to Visit  Medication Sig Dispense Refill   Ascorbic Acid (VITAMIN C)  100 MG tablet Take 100 mg by mouth daily.     fluticasone (FLONASE) 50 MCG/ACT nasal spray Place 2 sprays into both nostrils daily. 48 g 3   naproxen (NAPROSYN) 500 MG tablet Take 1 tablet (500 mg total) by mouth 2 (two) times daily with a meal. 20 tablet 2   omeprazole (PRILOSEC OTC)  20 MG tablet Take by mouth.     omeprazole (PRILOSEC) 20 MG capsule Take 1 capsule (20 mg total) by mouth daily. 90 capsule 1   sertraline (ZOLOFT) 100 MG tablet Take 1 tablet (100 mg total) by mouth daily. 90 tablet 0   Zinc Sulfate (ZINC 15 PO) Take by mouth.     betamethasone valerate ointment (VALISONE) 0.1 % Apply a pea sized amount topically BID for up to 2 weeks. Not meant for daily long term use. 30 g 0   Biotin 10 MG CAPS Take by mouth daily. (Patient not taking: Reported on 03/26/2020)     ondansetron (ZOFRAN ODT) 4 MG disintegrating tablet Take 1 tablet (4 mg total) by mouth every 8 (eight) hours as needed. (Patient not taking: Reported on 03/26/2020) 20 tablet 0   No current facility-administered medications on file prior to visit.    There are no Patient Instructions on file for this visit. No follow-ups on file.   Kris Hartmann, NP

## 2021-05-01 ENCOUNTER — Other Ambulatory Visit: Payer: Self-pay | Admitting: Family Medicine

## 2021-05-01 DIAGNOSIS — Z1231 Encounter for screening mammogram for malignant neoplasm of breast: Secondary | ICD-10-CM

## 2021-05-23 ENCOUNTER — Other Ambulatory Visit: Payer: Self-pay

## 2021-05-23 ENCOUNTER — Ambulatory Visit
Admission: RE | Admit: 2021-05-23 | Discharge: 2021-05-23 | Disposition: A | Discharge status: Home / Self Care | Payer: BC Managed Care – PPO | Source: Ambulatory Visit | Attending: Family Medicine | Admitting: Family Medicine

## 2021-05-23 DIAGNOSIS — Z1231 Encounter for screening mammogram for malignant neoplasm of breast: Secondary | ICD-10-CM | POA: Diagnosis not present

## 2021-05-31 ENCOUNTER — Other Ambulatory Visit: Payer: Self-pay | Admitting: Family Medicine

## 2021-05-31 DIAGNOSIS — F419 Anxiety disorder, unspecified: Secondary | ICD-10-CM

## 2021-06-12 DIAGNOSIS — Z1211 Encounter for screening for malignant neoplasm of colon: Secondary | ICD-10-CM | POA: Insufficient documentation

## 2021-06-12 DIAGNOSIS — K5730 Diverticulosis of large intestine without perforation or abscess without bleeding: Secondary | ICD-10-CM | POA: Insufficient documentation

## 2021-06-12 DIAGNOSIS — R1032 Left lower quadrant pain: Secondary | ICD-10-CM | POA: Insufficient documentation

## 2021-06-12 DIAGNOSIS — E119 Type 2 diabetes mellitus without complications: Secondary | ICD-10-CM | POA: Insufficient documentation

## 2021-06-12 DIAGNOSIS — R1310 Dysphagia, unspecified: Secondary | ICD-10-CM | POA: Insufficient documentation

## 2021-06-12 DIAGNOSIS — R1031 Right lower quadrant pain: Secondary | ICD-10-CM | POA: Insufficient documentation

## 2021-06-13 DIAGNOSIS — E119 Type 2 diabetes mellitus without complications: Secondary | ICD-10-CM | POA: Diagnosis not present

## 2021-06-13 DIAGNOSIS — M1711 Unilateral primary osteoarthritis, right knee: Secondary | ICD-10-CM | POA: Diagnosis not present

## 2021-06-20 DIAGNOSIS — M1711 Unilateral primary osteoarthritis, right knee: Secondary | ICD-10-CM | POA: Diagnosis not present

## 2021-07-02 NOTE — Progress Notes (Signed)
65 y.o. G3P3 Married White or Caucasian Not Hispanic or Latino female here for annual exam.    She is having some intermittent right groin pain. Hurts if she moves in certain positions or lifts anything heavy. At times the pain is sharp, lasts for 30 seconds. She doesn't notice a lump.  She is concerned that she may have a hernia in her right groin. CT in 3/21 for kidney stones incidentally showed:  Fatty right inguinal hernia. Small fatty umbilical hernia.   H/O grade 2-3 cystocele and grade one uterine prolapse and rectocele. She notices the bulge a little more when she is wiping. Infrequently aware of it if she isn't wiping.  H/o mixed incontinence. Urge is worse than stress. She leaks a small amount 5/7 days. GSI is infrequent, only with a really bad cough or hard sneeze. No bowel c/o.  She previously tried a #7 ring pessary, but it fell out with BM. Not currently wanting to try a different pessary.   Sexually active, no pain.   H/O prediabetes, last HgbA1C was 6.3% in 8/22  Patient's last menstrual period was 04/06/2008.          Sexually active: Yes.    The current method of family planning is post menopausal status.    Exercising: Yes.    Gym/ health club routine includes low impact aerobics. 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 She has never used smokeless tobacco. She reports current alcohol use of about 5.0 - 6.0 standard drinks per week. She reports that she does not use drugs. 3 grown kids, all local, has 70 young grand children (3.5-10 years). She retired from [employer] earlier this year.  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     fluticasone (FLONASE) 50 MCG/ACT nasal spray Place 2 sprays into both nostrils daily. 48 g 3   naproxen (NAPROSYN) 500 MG tablet Take 1 tablet (500 mg total) by mouth 2 (two) times daily with a meal. 20 tablet 2   omeprazole (PRILOSEC OTC) 20 MG tablet Take by mouth.     sertraline (ZOLOFT) 100 MG tablet TAKE 1 TABLET BY MOUTH EVERY DAY 90 tablet 0   Zinc Sulfate (ZINC 15 PO) Take by mouth.     No current facility-administered medications for this visit.    Family History  Problem Relation Age of Onset   Hypertension Mother    Heart disease  Father    Breast cancer Other    Osteoporosis Maternal Grandmother    Ovarian cancer Cousin 22    Review of Systems  All other systems reviewed and are negative.  Exam:   BP 128/72    Pulse 82    Ht 5\' 3"  (1.6 m)    Wt 234 lb (106.1 kg)    LMP 04/06/2008    SpO2 99%    BMI 41.45 kg/m   Weight change: @WEIGHTCHANGE @ Height:   Height: 5\' 3"  (160 cm)  Ht Readings from Last 3 Encounters:  07/04/21 5\' 3"  (1.6 m)  03/19/21 5\' 3"  (1.6 m)  02/26/21 5\' 3"  (1.6 m)    General appearance: alert, cooperative and appears stated age Head: Normocephalic, without obvious abnormality, atraumatic Neck: no adenopathy, supple, symmetrical, trachea midline and thyroid normal to inspection and palpation Lungs: clear to auscultation bilaterally Cardiovascular: regular rate and rhythm Breasts:  normal appearance, no masses or tenderness Abdomen: soft, non-tender; non distended,  no masses,  no organomegaly Extremities: extremities normal, atraumatic, no cyanosis or edema Skin: Skin color, texture, turgor normal. No rashes or lesions Lymph nodes: Cervical, supraclavicular, and axillary nodes normal. No abnormal inguinal nodes palpated Neurologic: Grossly normal Right groin: no obvious hernia.    Pelvic: External genitalia:  no lesions              Urethra:  normal appearing urethra with no masses, tenderness or lesions              Bartholins and Skenes: normal                 Vagina: normal appearing vagina with normal color and discharge, no lesions. With valsalva her cystocele comes ~ 1 cm outside of the introitus. The cervix is ~1.5 cm above the introitus, small grade 2 rectocele. The patient was examined supine and standing with and without valsalva. No leakage with coughing and reduction of the prolapse.              Cervix: no lesions               Bimanual Exam:  Uterus:   no masses or tenderness              Adnexa: no mass, fullness, tenderness               Rectovaginal: Confirms               Anus:  normal sphincter tone, no lesions  Gae Dry chaperoned for the exam.  1. Well woman exam Discussed breast self exam Discussed calcium and vit D intake No pap this year Mammogram and colonoscopy UTD  2. Midline cystocele Slightly worsening We discussed the options of doing nothing, trying a different pessary and surgery. She is interested in surgery. We discussed the option of TVH/A&P repair/uterosacral ligament suspension and Robotic hysterectomy with sacrocolpopexy +/- A&P repair.  -We discussed possible occult worsening of GSI -Will refer to Dr Wannetta Sender for a consultation -We did discuss the risk of recurrent prolapse after surgery.  3. Uterine prolapse See above  4. Rectocele See above  5. Mixed incontinence  6. Right groin pain Prior CT showed a  right inguinal hernia, now having pain with lifting and some movement. Will refer to General Surgery for a consultation.   In addition to the annual exam, over 10 minutes was spent in evaluation of her prolapse and counseling on treatment options.

## 2021-07-04 ENCOUNTER — Other Ambulatory Visit: Payer: Self-pay

## 2021-07-04 ENCOUNTER — Encounter: Payer: Self-pay | Admitting: Obstetrics and Gynecology

## 2021-07-04 ENCOUNTER — Telehealth: Payer: Self-pay

## 2021-07-04 ENCOUNTER — Ambulatory Visit (INDEPENDENT_AMBULATORY_CARE_PROVIDER_SITE_OTHER): Payer: BC Managed Care – PPO | Admitting: Obstetrics and Gynecology

## 2021-07-04 VITALS — BP 128/72 | HR 82 | Ht 63.0 in | Wt 234.0 lb

## 2021-07-04 DIAGNOSIS — N8111 Cystocele, midline: Secondary | ICD-10-CM | POA: Diagnosis not present

## 2021-07-04 DIAGNOSIS — N816 Rectocele: Secondary | ICD-10-CM

## 2021-07-04 DIAGNOSIS — Z01419 Encounter for gynecological examination (general) (routine) without abnormal findings: Secondary | ICD-10-CM | POA: Diagnosis not present

## 2021-07-04 DIAGNOSIS — N814 Uterovaginal prolapse, unspecified: Secondary | ICD-10-CM | POA: Diagnosis not present

## 2021-07-04 DIAGNOSIS — N3946 Mixed incontinence: Secondary | ICD-10-CM | POA: Diagnosis not present

## 2021-07-04 DIAGNOSIS — N819 Female genital prolapse, unspecified: Secondary | ICD-10-CM

## 2021-07-04 DIAGNOSIS — R1031 Right lower quadrant pain: Secondary | ICD-10-CM

## 2021-07-04 NOTE — Telephone Encounter (Signed)
Referral order placed in Epic for Dr. Ruby Cola.  Message sent to referral coordinator at [facility] for referral to surgeon.

## 2021-07-04 NOTE — Patient Instructions (Signed)

## 2021-07-04 NOTE — Telephone Encounter (Signed)
Message from referral coordinator at [facility]:  07/26/21 @ 9:20 am with Dr.Blackman. Patient is aware of appt.

## 2021-07-04 NOTE — Telephone Encounter (Signed)
Salvadore Dom, MD  [facility] Triage  Please set this patient up for a consultation with Dr Wannetta Sender for genital prolapse and General Surgery for Right Groin Pain and h/o inguinal hernia (noted on CT).

## 2021-07-07 DIAGNOSIS — K4090 Unilateral inguinal hernia, without obstruction or gangrene, not specified as recurrent: Secondary | ICD-10-CM

## 2021-07-07 HISTORY — DX: Unilateral inguinal hernia, without obstruction or gangrene, not specified as recurrent: K40.90

## 2021-07-16 DIAGNOSIS — L570 Actinic keratosis: Secondary | ICD-10-CM | POA: Diagnosis not present

## 2021-07-16 DIAGNOSIS — L578 Other skin changes due to chronic exposure to nonionizing radiation: Secondary | ICD-10-CM | POA: Diagnosis not present

## 2021-07-16 DIAGNOSIS — Z85828 Personal history of other malignant neoplasm of skin: Secondary | ICD-10-CM | POA: Diagnosis not present

## 2021-07-16 DIAGNOSIS — Z872 Personal history of diseases of the skin and subcutaneous tissue: Secondary | ICD-10-CM | POA: Diagnosis not present

## 2021-07-26 ENCOUNTER — Other Ambulatory Visit: Payer: Self-pay | Admitting: Surgery

## 2021-07-26 DIAGNOSIS — K4090 Unilateral inguinal hernia, without obstruction or gangrene, not specified as recurrent: Secondary | ICD-10-CM | POA: Diagnosis not present

## 2021-08-01 DIAGNOSIS — M1711 Unilateral primary osteoarthritis, right knee: Secondary | ICD-10-CM | POA: Diagnosis not present

## 2021-08-01 DIAGNOSIS — E119 Type 2 diabetes mellitus without complications: Secondary | ICD-10-CM | POA: Diagnosis not present

## 2021-08-05 DIAGNOSIS — M1711 Unilateral primary osteoarthritis, right knee: Secondary | ICD-10-CM | POA: Insufficient documentation

## 2021-08-06 ENCOUNTER — Encounter: Payer: BC Managed Care – PPO | Admitting: Family Medicine

## 2021-08-14 ENCOUNTER — Other Ambulatory Visit: Payer: Self-pay

## 2021-08-14 ENCOUNTER — Encounter: Payer: Self-pay | Admitting: Obstetrics and Gynecology

## 2021-08-14 ENCOUNTER — Ambulatory Visit (INDEPENDENT_AMBULATORY_CARE_PROVIDER_SITE_OTHER): Payer: BC Managed Care – PPO | Admitting: Obstetrics and Gynecology

## 2021-08-14 VITALS — BP 124/76 | HR 86 | Ht 63.0 in | Wt 223.0 lb

## 2021-08-14 DIAGNOSIS — N8110 Cystocele, unspecified: Secondary | ICD-10-CM | POA: Diagnosis not present

## 2021-08-14 DIAGNOSIS — N812 Incomplete uterovaginal prolapse: Secondary | ICD-10-CM

## 2021-08-14 DIAGNOSIS — N816 Rectocele: Secondary | ICD-10-CM | POA: Diagnosis not present

## 2021-08-14 DIAGNOSIS — R350 Frequency of micturition: Secondary | ICD-10-CM | POA: Diagnosis not present

## 2021-08-14 DIAGNOSIS — N393 Stress incontinence (female) (male): Secondary | ICD-10-CM | POA: Diagnosis not present

## 2021-08-14 DIAGNOSIS — N3281 Overactive bladder: Secondary | ICD-10-CM

## 2021-08-14 LAB — POCT URINALYSIS DIPSTICK
Appearance: NORMAL
Bilirubin, UA: NEGATIVE
Blood, UA: NEGATIVE
Glucose, UA: NEGATIVE
Ketones, UA: NEGATIVE
Leukocytes, UA: NEGATIVE
Nitrite, UA: NEGATIVE
Protein, UA: NEGATIVE
Spec Grav, UA: 1.02 (ref 1.010–1.025)
Urobilinogen, UA: 0.2 E.U./dL
pH, UA: 6 (ref 5.0–8.0)

## 2021-08-14 NOTE — Progress Notes (Signed)
[facility] New Patient Evaluation and Consultation  Referring Provider: Salvadore Dom, MD PCP: Jerrol Banana., MD Date of Service: 08/14/2021  SUBJECTIVE Chief Complaint: New Patient (Initial Visit) Evelyn Hayes is a 66 y.o. female here for a evaluation in cystocele. Pt said she also has 2 hernia's. /)  History of Present Illness: Evelyn Hayes is a 66 y.o. White or Caucasian female seen in consultation at the request of Dr. Talbert Nan for evaluation of prolapse and incontinence.    Review of records from Dr Talbert Nan significant for: Has a cystocele and uterine prolapse. Has used a #7 ring pessary but fell out with BM. Has mixed incontinence, Urge > SUI.   She is planning an inguinal hernia repair with general surgery (had visit with Dr Ninfa Linden on 07/26/21)  Urinary Symptoms: Leaks urine with with movement to the bathroom. Reports some leakage with stress with full bladder.  Leaks 1-2 time(s) per day. More often with urgency.  Pad use: none She is bothered by her UI symptoms.  Day time voids 4-5.  Nocturia: 1-2 per week  Voiding dysfunction: she does not empty her bladder well.  does not use a catheter to empty bladder.  When urinating, she feels a weak stream Drinks: water  UTIs:  0  UTI's in the last year.   Reports history of kidney or bladder stones  Pelvic Organ Prolapse Symptoms:                  She Admits to a feeling of a bulge the vaginal area. It has been present for 4- 5 years.  She Admits to seeing a bulge.  This bulge is bothersome.  Has used a pessary before but it did not help.   Bowel Symptom: Bowel movements: 2 time(s) per day Stool consistency: soft  Straining: no.  Splinting: no.  Incomplete evacuation: no.  She Denies accidental bowel leakage / fecal incontinence Bowel regimen: none Last colonoscopy: Date 2020- diverticulosis  Sexual Function Sexually active: yes.  Sexual orientation:  heterosexual Pain with  sex: No  Pelvic Pain Admits to pelvic pain Location: bottom right side lower pelvis, behind naval area Pain occurs: with bending and twisting Prior pain treatment: none Improved by: breathing Worsened by: straining   Past Medical History:  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   Prediabetes      Past Surgical History:   Past Surgical History:  Procedure Laterality Date   CERVICAL BIOPSY  W/ 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   COLPOSCOPY  1998   hypo thermal ablation  06/02/05   KNEE SURGERY  07/2004   TONSILLECTOMY AND ADENOIDECTOMY     WISDOM TOOTH EXTRACTION       Past OB/GYN History: OB History  Gravida Para Term Preterm AB Living  3 3       3   SAB IAB Ectopic Multiple Live Births               # Outcome Date GA Lbr Len/2nd Weight Sex Delivery Anes PTL Lv  3 Para 1991    F Vag-Spont     2 Para 1986    F Vag-Spont     1 Para 1983    F Vag-Spont  Menopausal: Yes, Denies vaginal bleeding since menopause  Last pap smear was normal.     Medications: She has a current medication list which includes the following prescription(s): vitamin c, fluticasone, naproxen, omeprazole, sertraline, and zinc sulfate.   Allergies: Patient has No Known Allergies.   Social History:  Social History   Tobacco Use   Smoking status: Former   Smokeless tobacco: Never   Tobacco comments:    smoked for about 2 years in high school  Vaping Use   Vaping Use: Never used  Substance Use Topics   Alcohol use: Yes    Alcohol/week: 5.0 - 6.0 standard drinks    Types: 5 - 6 Standard drinks or equivalent per week   Drug use: No    Relationship status: married She lives with husband.   She is not employed. Regular exercise: Yes: water aerobics History of abuse:  No  Family History:   Family History  Problem Relation Age of Onset   Hypertension Mother    Heart disease Father    Breast cancer Other    Osteoporosis Maternal Grandmother    Ovarian cancer Cousin 36     Review of Systems: Review of Systems  Constitutional:  Negative for fever, malaise/fatigue and weight loss.  Respiratory:  Negative for cough, shortness of breath and wheezing.   Cardiovascular:  Negative for chest pain, palpitations and leg swelling.  Gastrointestinal:  Negative for abdominal pain and blood in stool.  Genitourinary:  Negative for dysuria.  Musculoskeletal:  Negative for myalgias.  Skin:  Negative for rash.  Neurological:  Negative for dizziness and headaches.  Endo/Heme/Allergies:  Does not bruise/bleed easily.  Psychiatric/Behavioral:  Negative for depression. The patient is not nervous/anxious.     OBJECTIVE Physical Exam: Vitals:   08/14/21 1532  BP: 124/76  Pulse: 86  Weight: 223 lb (101.2 kg)  Height: 5\' 3"  (1.6 m)    Physical Exam Constitutional:      General: She is not in acute distress. Pulmonary:     Effort: Pulmonary effort is normal.  Abdominal:     General: There is no distension.     Palpations: Abdomen is soft.     Tenderness: There is no abdominal tenderness. There is no rebound.  Musculoskeletal:        General: No swelling. Normal range of motion.  Skin:    General: Skin is warm and dry.     Findings: No rash.  Neurological:     Mental Status: She is alert and oriented to person, place, and time.  Psychiatric:        Mood and Affect: Mood normal.        Behavior: Behavior normal.     GU / Detailed Urogynecologic Evaluation:  Pelvic Exam: Normal external female genitalia; Bartholin's and Skene's glands normal in appearance; urethral meatus normal in appearance, no urethral masses or discharge.   CST: negative  Speculum exam reveals normal vaginal mucosa with atrophy. Cervix normal appearance. Uterus normal single,  nontender. Adnexa no mass, fullness, tenderness.    Pelvic floor strength II/V  Pelvic floor musculature: Right levator non-tender, Right obturator non-tender, Left levator non-tender, Left obturator non-tender  POP-Q:   POP-Q  2                                            Aa   2  Ba  -3                                              C   5                                            Gh  5                                            Pb  9                                            tvl   0                                            Ap  0                                            Bp  -5                                              D     Rectal Exam:  Normal external rectum  Post-Void Residual (PVR) by Bladder Scan: In order to evaluate bladder emptying, we discussed obtaining a postvoid residual and she agreed to this procedure.  Procedure: The ultrasound unit was placed on the patient's abdomen in the suprapubic region after the patient had voided. A PVR of 49 ml was obtained by bladder scan.  Laboratory Results: POC urine: negative   ASSESSMENT AND PLAN Ms. Sadowski is a 66 y.o. with:  1. Uterovaginal prolapse, incomplete   2. Prolapse of anterior vaginal wall   3. Prolapse of posterior vaginal wall   4. SUI (stress urinary incontinence, female)   5. Urinary frequency   6. Overactive bladder    Stage III anterior, Stage II posterior, Stage I apical prolapse - For treatment of pelvic organ prolapse, we discussed options for management including expectant management, conservative management, and surgical management, such as Kegels, a pessary, pelvic floor physical therapy, and specific surgical procedures. - We discussed two options for prolapse repair:  1) vaginal repair without mesh - Pros - safer, no mesh complications - Cons - not as strong as mesh repair, higher risk of recurrence  2) laparoscopic repair with mesh -  Pros - stronger, better long-term success - Cons - risks of mesh implant (erosion into vagina or bladder, adhering to the rectum, pain) - these risks are lower than with a vaginal mesh but still exist - Handouts provided on both options and she will consider each before deciding on surgical route.  - She is interested in concurrent surgery for  inguinal and umbilical hernia repair ([facility]). Will contact them to arrange once she has decided on surgery route.   2. SUI - For treatment of stress urinary incontinence,  non-surgical options include expectant management, weight loss, physical therapy, as well as a pessary.  Surgical options include a midurethral sling, and transurethral injection of a bulking agent. - She is interested in a sling at the time of surgery. Will have her return for simple CMG to demonstrate leakage.   3. OAB -We discussed the symptoms of overactive bladder (OAB), which include urinary urgency, urinary frequency, nocturia, with or without urge incontinence.  While we do not know the exact etiology of OAB, several treatment options exist. We discussed management including behavioral therapy (decreasing bladder irritants, urge suppression strategies, timed voids, bladder retraining), physical therapy, medication.  - She would like to focus on treatment of prolapse first before addressing urgency symptoms.   Return for simple CMG  Jaquita Folds, MD   Medical Decision Making:  - Reviewed/ ordered a clinical laboratory test - Reviewed/ ordered medicine test - Review and summation of prior records

## 2021-09-04 ENCOUNTER — Ambulatory Visit: Payer: BC Managed Care – PPO | Admitting: Obstetrics and Gynecology

## 2021-09-04 NOTE — Progress Notes (Signed)
[facility] Return Visit  SUBJECTIVE  History of Present Illness: Evelyn Hayes is a 66 y.o. female seen in follow-up for simple CMG and to discuss plan for surgery.   She prefers to proceed with robotic procedure for prolapse.   Past Medical History: Patient  has a past medical history of Anxiety, CPAP (continuous positive airway pressure) dependence (05/2011), GERD (gastroesophageal reflux disease), Kidney stone, Knee cartilage, torn, right (2003), Laryngopharyngeal reflux (12/2005), and Prediabetes.   Past Surgical History: She  has a past surgical history that includes hypo thermal ablation (06/02/05); Cervical biopsy w/ loop electrode excision (1996); Tonsillectomy and adenoidectomy; Knee surgery (07/2004); Colposcopy (1998); and Wisdom tooth extraction.   Medications: She has a current medication list which includes the following prescription(s): vitamin c, fluticasone, naproxen, omeprazole, sertraline, and zinc sulfate.   Allergies: Patient has No Known Allergies.   Social History: Patient  reports that she has quit smoking. She has never used smokeless tobacco. She reports current alcohol use of about 5.0 - 6.0 standard drinks per week. She reports that she does not use drugs.      OBJECTIVE     Physical Exam: Vitals:   09/05/21 1621  BP: 119/82  Pulse: 80   Gen: No apparent distress, A&O x 3.  Detailed Urogynecologic Evaluation:  Deferred. Prior exam showed:  POP-Q:    POP-Q   2                                            Aa   2                                           Ba   -3                                              C    5                                            Gh   5                                            Pb   9                                            tvl    0                                            Ap   0  Bp   -5                                              D        Verbal consent was obtained to perform simple CMG procedure:   Prolapse was reduced using 2 large cotton swabs. Urethra was prepped with betadine and a 82F catheter was placed and bladder was drained completely. The bladder was then backfilled with sterile water by gravity.  First sensation: 153ml First Desire: 234ml Strong Desire: 231ml Capacity: 540ml Cough stress test was positive. Valsalva stress test was negative.  She was was allowed to void on her own.   Interpretation: CMG showed normal sensation, and normal cystometric capacity. Findings positive for stress incontinence, negative for detrusor overactivity.     ASSESSMENT AND PLAN    Evelyn Hayes is a 66 y.o. with:  1. Uterovaginal prolapse, incomplete   2. Prolapse of anterior vaginal wall   3. Prolapse of posterior vaginal wall   4. SUI (stress urinary incontinence, female)      Plan for surgery: Exam under anesthesia, robotic assisted total laparoscopic hysterectomy with bilateral salpingectomy, sacrocolpopexy, midurethral sling, cystoscopy  - We reviewed the patient's specific anatomic and functional findings, with the assistance of diagrams, and together finalized the above procedure. The planned surgical procedures were discussed along with the surgical risks outlined below, which were also provided on a detailed handout. Additional treatment options including expectant management, conservative management, medical management were discussed where appropriate.  We reviewed the benefits and risks of each treatment option.   General Surgical Risks: For all procedures, there are risks of bleeding, infection, damage to surrounding organs including but not limited to bowel, bladder, blood vessels, ureters and nerves, and need for further surgery if an injury were to occur. These risks are all low with minimally invasive surgery.   There are risks of numbness and weakness at any body site or buttock/rectal pain.  It is possible  that baseline pain can be worsened by surgery, either with or without mesh. If surgery is vaginal, there is also a low risk of possible conversion to laparoscopy or open abdominal incision where indicated. Very rare risks include blood transfusion, blood clot, heart attack, pneumonia, or death.   There is also a risk of short-term postoperative urinary retention with need to use a catheter. About half of patients need to go home from surgery with a catheter, which is then later removed in the office. The risk of long-term need for a catheter is very low. There is also a risk of worsening of overactive bladder.   Sling: The effectiveness of a midurethral vaginal mesh sling is approximately 85%, and thus, there will be times when you may leak urine after surgery, especially if your bladder is full or if you have a strong cough. There is a balance between making the sling tight enough to treat your leakage but not too tight so that you have long-term difficulty emptying your bladder. A mesh sling will not directly treat overactive bladder/urge incontinence and may worsen it.  There is an FDA safety notification on vaginal mesh procedures for prolapse but NOT mesh slings. We have extensive experience and training with mesh placement and we have close postoperative follow up to identify any potential complications from mesh. It is important to realize that this mesh is a  permanent implant that cannot be easily removed. There are rare risks of mesh exposure (2-4%), pain with intercourse (0-7%), and infection (<1%). The risk of mesh exposure if more likely in a woman with risks for poor healing (prior radiation, poorly controlled diabetes, or immunocompromised). The risk of new or worsened chronic pain after mesh implant is more common in women with baseline chronic pain and/or poorly controlled anxiety or depression. Approximately 2-4% of patients will experience longer-term post-operative voiding dysfunction that  may require surgical revision of the sling. We also reviewed that postoperatively, her stream may not be as strong as before surgery.   Prolapse (with or without mesh): Risk factors for surgical failure  include things that put pressure on your pelvis and the surgical repair, including obesity, chronic cough, and heavy lifting or straining (including lifting children or adults, straining on the toilet, or lifting heavy objects such as furniture or anything weighing >25 lbs. Risks of recurrence is 20-30% with vaginal native tissue repair and a less than 10% with sacrocolpopexy with mesh.    Sacrocolpopexy: Mesh implants may provide more prolapse support, but do have some unique risks to consider. It is important to understand that mesh is permanent and cannot be easily removed. Risks of abdominal sacrocolpopexy mesh include mesh exposure (~3-6%), painful intercourse (recent studies show lower rates after surgery compared to before, with ~5-8% risk of new onset), and very rare risks of bowel or bladder injury or infection (<1%). The risk of mesh exposure is more likely in a woman with risks for poor healing (prior radiation, poorly controlled diabetes, or immunocompromised). The risk of new or worsened chronic pain after mesh implant is more common in women with baseline chronic pain and/or poorly controlled anxiety or depression. There is an FDA safety notification on vaginal mesh procedures for prolapse but NOT abdominal mesh procedures and therefore does not apply to your surgery. We have extensive experience and training with mesh placement and we have close postoperative follow up to identify any potential complications from mesh.    - For preop Visit:  She is required to have a visit within 30 days of her surgery.   - Will contact Dr Ninfa Linden who she has seen at [facility] surgery to evaluate whether a co-surgery can be performed. Patient is looking to have surgery done in the fall since she is  also looking to have a knee replacement before then.  - Medical clearance: not required  - Anticoagulant use: No - Medicaid Hysterectomy form: No - Accepts blood transfusion: will confirm at pre op - Expected length of stay: outpatient  Request sent for surgery scheduling.   Jaquita Folds, MD

## 2021-09-05 ENCOUNTER — Encounter: Payer: Self-pay | Admitting: Obstetrics and Gynecology

## 2021-09-05 ENCOUNTER — Other Ambulatory Visit: Payer: Self-pay

## 2021-09-05 ENCOUNTER — Ambulatory Visit (INDEPENDENT_AMBULATORY_CARE_PROVIDER_SITE_OTHER): Payer: BC Managed Care – PPO | Admitting: Obstetrics and Gynecology

## 2021-09-05 VITALS — BP 119/82 | HR 80

## 2021-09-05 DIAGNOSIS — N812 Incomplete uterovaginal prolapse: Secondary | ICD-10-CM

## 2021-09-05 DIAGNOSIS — N393 Stress incontinence (female) (male): Secondary | ICD-10-CM | POA: Diagnosis not present

## 2021-09-05 DIAGNOSIS — N816 Rectocele: Secondary | ICD-10-CM | POA: Diagnosis not present

## 2021-09-05 DIAGNOSIS — N8110 Cystocele, unspecified: Secondary | ICD-10-CM

## 2021-09-06 ENCOUNTER — Encounter: Payer: Self-pay | Admitting: Obstetrics and Gynecology

## 2021-09-10 ENCOUNTER — Encounter: Payer: Self-pay | Admitting: Obstetrics and Gynecology

## 2021-10-16 DIAGNOSIS — K429 Umbilical hernia without obstruction or gangrene: Secondary | ICD-10-CM | POA: Diagnosis not present

## 2021-10-16 DIAGNOSIS — K4090 Unilateral inguinal hernia, without obstruction or gangrene, not specified as recurrent: Secondary | ICD-10-CM | POA: Diagnosis not present

## 2021-10-17 ENCOUNTER — Other Ambulatory Visit: Payer: BC Managed Care – PPO

## 2021-10-23 ENCOUNTER — Encounter: Payer: Self-pay | Admitting: Obstetrics and Gynecology

## 2021-10-28 ENCOUNTER — Ambulatory Visit: Admit: 2021-10-28 | Payer: BC Managed Care – PPO | Admitting: Orthopedic Surgery

## 2021-10-28 ENCOUNTER — Other Ambulatory Visit: Payer: Self-pay | Admitting: Family Medicine

## 2021-10-28 DIAGNOSIS — K219 Gastro-esophageal reflux disease without esophagitis: Secondary | ICD-10-CM

## 2021-10-28 SURGERY — ARTHROPLASTY, KNEE, TOTAL, USING IMAGELESS COMPUTER-ASSISTED NAVIGATION
Anesthesia: Choice | Site: Knee | Laterality: Right

## 2021-11-13 DIAGNOSIS — M1711 Unilateral primary osteoarthritis, right knee: Secondary | ICD-10-CM | POA: Diagnosis not present

## 2021-11-14 ENCOUNTER — Ambulatory Visit (INDEPENDENT_AMBULATORY_CARE_PROVIDER_SITE_OTHER): Payer: BC Managed Care – PPO | Admitting: Family Medicine

## 2021-11-14 ENCOUNTER — Encounter: Payer: Self-pay | Admitting: Family Medicine

## 2021-11-14 ENCOUNTER — Encounter: Payer: Self-pay | Admitting: *Deleted

## 2021-11-14 VITALS — BP 124/72 | HR 83 | Resp 16 | Ht 63.0 in | Wt 219.0 lb

## 2021-11-14 DIAGNOSIS — Z79899 Other long term (current) drug therapy: Secondary | ICD-10-CM

## 2021-11-14 DIAGNOSIS — K219 Gastro-esophageal reflux disease without esophagitis: Secondary | ICD-10-CM | POA: Diagnosis not present

## 2021-11-14 DIAGNOSIS — E6609 Other obesity due to excess calories: Secondary | ICD-10-CM

## 2021-11-14 DIAGNOSIS — E7800 Pure hypercholesterolemia, unspecified: Secondary | ICD-10-CM

## 2021-11-14 DIAGNOSIS — R7303 Prediabetes: Secondary | ICD-10-CM

## 2021-11-14 DIAGNOSIS — Z6839 Body mass index (BMI) 39.0-39.9, adult: Secondary | ICD-10-CM

## 2021-11-14 DIAGNOSIS — F4321 Adjustment disorder with depressed mood: Secondary | ICD-10-CM

## 2021-11-14 NOTE — Patient Instructions (Signed)
DECREASE SERTRALINE TO 50 MG DAILY. ?

## 2021-11-14 NOTE — Progress Notes (Signed)
?  ? ? ?Established patient visit ? ?I,April Miller,acting as a scribe for Wilhemena Durie, MD.,have documented all relevant documentation on the behalf of Wilhemena Durie, MD,as directed by  Wilhemena Durie, MD while in the presence of Wilhemena Durie, MD. ? ? ?[name]   DOB: Jan 28, 1956   66 y.o. Female  MRN: [id number] ?Visit Date: 11/14/2021 ? ?Today's healthcare provider: Wilhemena Durie, MD  ? ?Chief Complaint  ?Patient presents with  ? Follow-up  ? Prediabetes  ? ?Subjective  ?  ?HPI  ?Patient comes in today for follow-up.  She is now retired and doing water aerobics 4 times per week.  She would like to consider a weight Hayes drug.  Emotionally she is feeling well and is okay cutting back and trying to get off of her sertraline. ?Prediabetes, Follow-up ? ?Lab Results  ?Component Value Date  ? HGBA1C 6.3 (H) 02/27/2021  ? HGBA1C 5.7 (H) 01/26/2019  ? HGBA1C 5.7 (H) 01/06/2018  ? GLUCOSE 107 (H) 02/27/2021  ? GLUCOSE 151 (H) 09/12/2019  ? GLUCOSE 92 01/26/2019  ? ? ?Last seen for for this9 months ago.  ?Management since that visit includes; labs checked. Patient advised to work on diet and exercise. ? ?Pertinent Labs: ?   ?Component Value Date/Time  ? CHOL 214 (H) 02/27/2021 1054  ? TRIG 98 02/27/2021 1054  ? CHOLHDL 4.1 02/27/2021 1054  ? CREATININE 0.77 02/27/2021 1054  ? ? ?Wt Readings from Last 3 Encounters:  ?11/14/21 219 lb (99.3 kg)  ?08/14/21 223 lb (101.2 kg)  ?07/04/21 234 lb (106.1 kg)  ? ? ?----------------------------------------------------------------------------------------- ? ? ?Medications: ?Outpatient Medications Prior to Visit  ?Medication Sig  ? Ascorbic Acid (VITAMIN C) 100 MG tablet Take 100 mg by mouth daily.  ? fluticasone (FLONASE) 50 MCG/ACT nasal spray Place 2 sprays into both nostrils daily.  ? Milk Thistle 1000 MG CAPS   ? naproxen (NAPROSYN) 500 MG tablet Take 1 tablet (500 mg total) by mouth 2 (two) times daily with a meal.  ? omeprazole (PRILOSEC  OTC) 20 MG tablet Take by mouth.  ? omeprazole (PRILOSEC) 20 MG capsule TAKE 1 CAPSULE DAILY  ? sertraline (ZOLOFT) 100 MG tablet TAKE 1 TABLET BY MOUTH EVERY DAY  ? Zinc Sulfate (ZINC 15 PO) Take by mouth.  ? ?No facility-administered medications prior to visit.  ? ? ?Review of Systems  ?Constitutional:  Negative for appetite change, chills, fatigue and fever.  ?Respiratory:  Negative for chest tightness and shortness of breath.   ?Cardiovascular:  Negative for chest pain and palpitations.  ?Gastrointestinal:  Negative for abdominal pain, nausea and vomiting.  ?Neurological:  Negative for dizziness and weakness.  ? ?Last hemoglobin A1c ?Lab Results  ?Component Value Date  ? HGBA1C 6.1 (H) 11/14/2021  ? ?  ?  Objective  ?  ?BP 124/72 (BP Location: Left Arm, Patient Position: Sitting, Cuff Size: Large)   Pulse 83   Resp 16   Ht '5\' 3"'$  (1.6 m)   Wt 219 lb (99.3 kg)   LMP 04/06/2008   SpO2 96%   BMI 38.79 kg/m?  ?BP Readings from Last 3 Encounters:  ?11/14/21 124/72  ?09/05/21 119/82  ?08/14/21 124/76  ? ?Wt Readings from Last 3 Encounters:  ?11/14/21 219 lb (99.3 kg)  ?08/14/21 223 lb (101.2 kg)  ?07/04/21 234 lb (106.1 kg)  ? ?  ? ?Physical Exam ?Vitals reviewed.  ?Constitutional:   ?   Appearance: She is well-developed.  ?HENT:  ?  Head: Normocephalic and atraumatic.  ?   Right Ear: External ear normal.  ?   Left Ear: External ear normal.  ?   Nose: Nose normal.  ?Eyes:  ?   General: No scleral icterus. ?   Conjunctiva/sclera: Conjunctivae normal.  ?Neck:  ?   Thyroid: No thyromegaly.  ?Cardiovascular:  ?   Rate and Rhythm: Normal rate and regular rhythm.  ?   Heart sounds: Normal heart sounds.  ?   Comments: Small varicose veins of the leg that are minimally tender but not engorged ?Pulmonary:  ?   Effort: Pulmonary effort is normal.  ?   Breath sounds: Normal breath sounds.  ?Abdominal:  ?   Palpations: Abdomen is soft.  ?Skin: ?   General: Skin is warm and dry.  ?Neurological:  ?   Mental Status: She is  alert and oriented to person, place, and time.  ?Psychiatric:     ?   Behavior: Behavior normal.     ?   Thought Content: Thought content normal.     ?   Judgment: Judgment normal.  ?  ? ? ?No results found for any visits on 11/14/21. ? Assessment & Plan  ?  ? ?1. Hypercholesteremia ? ?- Lipid panel ?- TSH ?- CBC w/Diff/Platelet ?- Comprehensive Metabolic Panel (CMET) ?- Hemoglobin A1c ? ?2. Borderline diabetes ?Consider Ozempic for her prediabetic state. ?- Lipid panel ?- TSH ?- CBC w/Diff/Platelet ?- Comprehensive Metabolic Panel (CMET) ?- Hemoglobin A1c ? ?3. Gastroesophageal reflux disease, unspecified whether esophagitis present ? ?- Lipid panel ?- TSH ?- CBC w/Diff/Platelet ?- Comprehensive Metabolic Panel (CMET) ?- Hemoglobin A1c ? ?4. Class 2 obesity due to excess calories without serious comorbidity with body mass index (BMI) of 39.0 to 39.9 in adult ?Patient is lost about 15 pounds since last visit with diet and exercise.  She probably needs about a 30% body weight reduction and then reassess. ?- Lipid panel ?- TSH ?- CBC w/Diff/Platelet ?- Comprehensive Metabolic Panel (CMET) ?- Hemoglobin A1c ? ?5. High risk medication use ?On omeprazole. ?- Lipid panel ?- TSH ?- CBC w/Diff/Platelet ?- Comprehensive Metabolic Panel (CMET) ?- Hemoglobin A1c ?6.  Adjustment reaction with depressed and anxious mood ?Sertraline back to 50 mg daily. ? ?Return in about 4 months (around 03/17/2022).  ?   ? ?I, Wilhemena Durie, MD, have reviewed all documentation for this visit. The documentation on 11/19/21 for the exam, diagnosis, procedures, and orders are all accurate and complete. ? ? ? ?Shakeya Kerkman Cranford Mon, MD  ?[facility] ?[phone] (phone) ?[phone] (fax) ? ?[facility] ?

## 2021-11-15 LAB — COMPREHENSIVE METABOLIC PANEL
ALT: 31 IU/L (ref 0–32)
AST: 20 IU/L (ref 0–40)
Albumin/Globulin Ratio: 2.2 (ref 1.2–2.2)
Albumin: 4.7 g/dL (ref 3.8–4.8)
Alkaline Phosphatase: 81 IU/L (ref 44–121)
BUN/Creatinine Ratio: 22 (ref 12–28)
BUN: 15 mg/dL (ref 8–27)
Bilirubin Total: 0.4 mg/dL (ref 0.0–1.2)
CO2: 21 mmol/L (ref 20–29)
Calcium: 9.7 mg/dL (ref 8.7–10.3)
Chloride: 103 mmol/L (ref 96–106)
Creatinine, Ser: 0.69 mg/dL (ref 0.57–1.00)
Globulin, Total: 2.1 g/dL (ref 1.5–4.5)
Glucose: 148 mg/dL — ABNORMAL HIGH (ref 70–99)
Potassium: 4.5 mmol/L (ref 3.5–5.2)
Sodium: 138 mmol/L (ref 134–144)
Total Protein: 6.8 g/dL (ref 6.0–8.5)
eGFR: 97 mL/min/{1.73_m2} (ref >59)

## 2021-11-15 LAB — HEMOGLOBIN A1C
Est. average glucose Bld gHb Est-mCnc: 128 mg/dL
Hgb A1c MFr Bld: 6.1 % — ABNORMAL HIGH (ref 4.8–5.6)

## 2021-11-15 LAB — CBC WITH DIFFERENTIAL/PLATELET
Basophils Absolute: 0 10*3/uL (ref 0.0–0.2)
Basos: 0 %
EOS (ABSOLUTE): 0 10*3/uL (ref 0.0–0.4)
Eos: 0 %
Hematocrit: 42.3 % (ref 34.0–46.6)
Hemoglobin: 14.3 g/dL (ref 11.1–15.9)
Immature Grans (Abs): 0 10*3/uL (ref 0.0–0.1)
Immature Granulocytes: 0 %
Lymphocytes Absolute: 1 10*3/uL (ref 0.7–3.1)
Lymphs: 7 %
MCH: 29.4 pg (ref 26.6–33.0)
MCHC: 33.8 g/dL (ref 31.5–35.7)
MCV: 87 fL (ref 79–97)
Monocytes Absolute: 0.3 10*3/uL (ref 0.1–0.9)
Monocytes: 3 %
Neutrophils Absolute: 11.9 10*3/uL — ABNORMAL HIGH (ref 1.4–7.0)
Neutrophils: 90 %
Platelets: 221 10*3/uL (ref 150–450)
RBC: 4.87 x10E6/uL (ref 3.77–5.28)
RDW: 13.1 % (ref 11.7–15.4)
WBC: 13.2 10*3/uL — ABNORMAL HIGH (ref 3.4–10.8)

## 2021-11-15 LAB — LIPID PANEL
Chol/HDL Ratio: 3.7 ratio (ref 0.0–4.4)
Cholesterol, Total: 200 mg/dL — ABNORMAL HIGH (ref 100–199)
HDL: 54 mg/dL (ref >39)
LDL Chol Calc (NIH): 135 mg/dL — ABNORMAL HIGH (ref 0–99)
Triglycerides: 59 mg/dL (ref 0–149)
VLDL Cholesterol Cal: 11 mg/dL (ref 5–40)

## 2021-11-15 LAB — TSH: TSH: 0.69 u[IU]/mL (ref 0.450–4.500)

## 2021-11-20 ENCOUNTER — Telehealth: Payer: Self-pay

## 2021-11-20 NOTE — Telephone Encounter (Signed)
Please advise pt's labs? ?

## 2021-11-20 NOTE — Telephone Encounter (Signed)
Copied from [place] [phone]. Topic: General - Other ?>> Nov 20, 2021 11:16 AM Loma [place] wrote: ?Pt wants Dr Alben Spittle nurse to call her. She states she has questions about labs and they a had discussed pending labs that he may or may not give her a new med, but she declined to give the name or any other info. Offered nurse assistance but she only wants to talk to Dr Darnell Level or his specific nurse,  Pls returning call [phone] ?

## 2021-11-21 ENCOUNTER — Encounter: Payer: Self-pay | Admitting: Family Medicine

## 2021-11-22 ENCOUNTER — Other Ambulatory Visit: Payer: Self-pay

## 2021-11-22 MED ORDER — TRULICITY 0.75 MG/0.5ML ~~LOC~~ SOAJ: 0.7500 mg | SUBCUTANEOUS | 2 refills | Status: DC

## 2021-11-26 ENCOUNTER — Ambulatory Visit: Payer: Self-pay | Admitting: *Deleted

## 2021-11-26 NOTE — Telephone Encounter (Signed)
  Chief Complaint: Bitten by a tick on Sat.  Requesting medication to prevent getting an infection due to upcoming travel. Symptoms: Red raised bump.   Tick removed Sunday morning Frequency: 1 bite mark Pertinent Negatives: Patient denies rash, headache or bull's eye appearance around bite site Disposition: '[]'$ ED /'[]'$ Urgent Care (no appt availability in office) / '[x]'$ Appointment(In office/virtual)/ '[]'$  [place] [facility]/ '[]'$ Home Care/ '[]'$ Refused Recommended Disposition /'[]'$ [place] [facility]/ '[]'$  Follow-up with PCP Additional Notes: Scheduled a MyChart virtual visit for 11/27/2021 with Dr. Rosanna Randy.  Dr. Rosanna Randy has started her on Trulicity.  Her insur. Co. Needing a prior authorization.   They are still waiting on Dr. Alben Spittle response.   It was ordered on 11/22/2021 and they haven't heard from Dr. Rosanna Randy.   She would like an update on the status of this.   She is active on MyChart.

## 2021-11-26 NOTE — Telephone Encounter (Signed)
I returned pt's call.   Was bitten by a tick right underarm.  Tick was there about 20 hrs before being removed.  Pt requesting something be prescribed to insure she is not infected.    Reason for Disposition  [1] Probable deer tick AND [2] attached 36 hours or more (or tick appears swollen, not flat) AND [3] occurred in an area where Lyme disease is common  Answer Assessment - Initial Assessment Questions 1. TYPE of TICK: "Is it a wood tick or a deer tick?" (e.g., deer tick, wood tick; unsure)     Bitten by a tick on Sat. Found it under my right underarm on Sun. morning 2. SIZE of TICK: "How big is the tick?" (e.g., size of poppy seed, apple seed, watermelon seed; unsure) Note: Deer ticks can be the size of a poppy seed (nymph) or an apple seed (adult).       Deer tick.     3. ENGORGED: "Did the tick look flat or engorged (full, swollen)?" (e.g., flat, engorged; unsure)     It's a red place now.   Tiny tick 4. LOCATION: "Where is the tick bite located?"      Under right arm 5. ONSET: "How long do you think the tick was attached before you removed it?" (e.g., 5 hours, 2 days)      Sunday it was found. I'm going on a trip and want a prescription to ward it off. 6. APPEARANCE of BITE or RASH: "What does the site look like?"     Red raised bump 7. PREGNANCY: "Is there any chance you are pregnant?" "When was your last menstrual period?"     N/A  Protocols used: Tick Bite-A-AH

## 2021-11-27 ENCOUNTER — Telehealth (INDEPENDENT_AMBULATORY_CARE_PROVIDER_SITE_OTHER): Payer: BC Managed Care – PPO | Admitting: Family Medicine

## 2021-11-27 DIAGNOSIS — S20361A Insect bite (nonvenomous) of right front wall of thorax, initial encounter: Secondary | ICD-10-CM

## 2021-11-27 DIAGNOSIS — R7303 Prediabetes: Secondary | ICD-10-CM | POA: Diagnosis not present

## 2021-11-27 DIAGNOSIS — E6609 Other obesity due to excess calories: Secondary | ICD-10-CM

## 2021-11-27 DIAGNOSIS — Z6839 Body mass index (BMI) 39.0-39.9, adult: Secondary | ICD-10-CM

## 2021-11-27 DIAGNOSIS — W57XXXA Bitten or stung by nonvenomous insect and other nonvenomous arthropods, initial encounter: Secondary | ICD-10-CM

## 2021-11-27 MED ORDER — DOXYCYCLINE HYCLATE 100 MG PO TABS: 100.0000 mg | ORAL_TABLET | Freq: Every day | ORAL | 0 refills | Status: DC

## 2021-11-27 NOTE — Progress Notes (Signed)
MyChart Video Visit    Virtual Visit via Video Note   This visit type was conducted due to national recommendations for restrictions regarding the COVID-19 Pandemic (e.g. social distancing) in an effort to limit this patient's exposure and mitigate transmission in our community. This patient is at least at moderate risk for complications without adequate follow up. This format is felt to be most appropriate for this patient at this time. Physical exam was limited by quality of the video and audio technology used for the visit.   Patient location: Home Provider location: Office  I discussed the limitations of evaluation and management by telemedicine and the availability of in person appointments. The patient expressed understanding and agreed to proceed.  [name]   DOB: 1955-12-13   66 y.o. Female  MRN: [id number] Visit Date: 11/27/2021  Today's healthcare provider: Wilhemena Durie, MD   No chief complaint on file.  Subjective    HPI  Very nice 66 year old comes in today with 2 issues.  The first is we were working on her prior authorization for Trulicity for her prediabetes The second is for a tick bite from 3 days ago under her right axilla.  He thinks the tick was on her for almost a full 24 hours.  Thinks it was a deer tick.  She is leaving in a week for a cruise and wishes to have this potentially treated.   Medications: Outpatient Medications Prior to Visit  Medication Sig   Ascorbic Acid (VITAMIN C) 100 MG tablet Take 100 mg by mouth daily.   Dulaglutide (TRULICITY) 1.32 GM/0.1UU SOPN Inject 0.75 mg into the skin once a week.   fluticasone (FLONASE) 50 MCG/ACT nasal spray Place 2 sprays into both nostrils daily.   Milk Thistle 1000 MG CAPS    naproxen (NAPROSYN) 500 MG tablet Take 1 tablet (500 mg total) by mouth 2 (two) times daily with a meal.   omeprazole (PRILOSEC OTC) 20 MG tablet Take by mouth.   omeprazole (PRILOSEC) 20 MG capsule TAKE 1  CAPSULE DAILY   sertraline (ZOLOFT) 100 MG tablet TAKE 1 TABLET BY MOUTH EVERY DAY   Zinc Sulfate (ZINC 15 PO) Take by mouth.   No facility-administered medications prior to visit.    Review of Systems  Last hemoglobin A1c Lab Results  Component Value Date   HGBA1C 6.1 (H) 11/14/2021       Objective    LMP 04/06/2008   BP Readings from Last 3 Encounters:  11/14/21 124/72  09/05/21 119/82  08/14/21 124/76   Wt Readings from Last 3 Encounters:  11/14/21 219 lb (99.3 kg)  08/14/21 223 lb (101.2 kg)  07/04/21 234 lb (106.1 kg)       Physical Exam  She is in no acute distress.  There is a sign of the erythema in her right axilla from the tick bite with no central clearing   Assessment & Plan     1. Class 2 obesity due to excess calories without serious comorbidity with body mass index (BMI) of 39.0 to 39.9 in adult Patient to work on diet and exercise  2. Borderline diabetes Working on Trulicity for V2Z of 6.1.  3. Tick bite of right front wall of thorax, initial encounter Cover with doxycycline 200 mg today   No follow-ups on file.     I discussed the assessment and treatment plan with the patient. The patient was provided an opportunity to ask questions and all were answered. The  patient agreed with the plan and demonstrated an understanding of the instructions.   The patient was advised to call back or seek an in-person evaluation if the symptoms worsen or if the condition fails to improve as anticipated.  I provided 14 minutes of non-face-to-face time during this encounter.  I, Wilhemena Durie, MD, have reviewed all documentation for this visit. The documentation on 11/27/21 for the exam, diagnosis, procedures, and orders are all accurate and complete.   Richard Cranford Mon, MD [facility] [phone] (phone) [phone] (fax)  [place]

## 2021-11-27 NOTE — Telephone Encounter (Signed)
Patient did mychart visit with provider today.

## 2021-12-24 ENCOUNTER — Encounter: Payer: Self-pay | Admitting: Obstetrics and Gynecology

## 2021-12-24 ENCOUNTER — Ambulatory Visit (INDEPENDENT_AMBULATORY_CARE_PROVIDER_SITE_OTHER): Payer: BC Managed Care – PPO | Admitting: Obstetrics and Gynecology

## 2021-12-24 VITALS — BP 109/70 | HR 80

## 2021-12-24 DIAGNOSIS — N393 Stress incontinence (female) (male): Secondary | ICD-10-CM | POA: Diagnosis not present

## 2021-12-24 DIAGNOSIS — N816 Rectocele: Secondary | ICD-10-CM

## 2021-12-24 DIAGNOSIS — N8110 Cystocele, unspecified: Secondary | ICD-10-CM | POA: Diagnosis not present

## 2021-12-24 DIAGNOSIS — N812 Incomplete uterovaginal prolapse: Secondary | ICD-10-CM

## 2021-12-24 DIAGNOSIS — N814 Uterovaginal prolapse, unspecified: Secondary | ICD-10-CM | POA: Diagnosis not present

## 2021-12-24 DIAGNOSIS — Z01818 Encounter for other preprocedural examination: Secondary | ICD-10-CM

## 2021-12-24 MED ORDER — ACETAMINOPHEN 500 MG PO TABS: 500.0000 mg | ORAL_TABLET | Freq: Four times a day (QID) | ORAL | 0 refills | Status: DC | PRN

## 2021-12-24 MED ORDER — OXYCODONE HCL 5 MG PO TABS
5.0000 mg | ORAL_TABLET | ORAL | 0 refills | Status: DC | PRN
Start: 2021-12-24 — End: 2022-02-25

## 2021-12-24 MED ORDER — IBUPROFEN 600 MG PO TABS: 600.0000 mg | ORAL_TABLET | Freq: Four times a day (QID) | ORAL | 0 refills | Status: DC | PRN

## 2021-12-24 MED ORDER — POLYETHYLENE GLYCOL 3350 17 GM/SCOOP PO POWD: 17.0000 g | Freq: Every day | ORAL | 0 refills | Status: AC

## 2021-12-24 NOTE — Progress Notes (Addendum)
[facility] Pre-Operative visit  Subjective Chief Complaint: Evelyn Hayes presents for a preoperative encounter.   History of Present Illness: Evelyn Hayes is a 66 y.o. female who presents for preoperative visit.  She is scheduled to undergo Exam under anesthesia, robotic assisted total laparoscopic hysterectomy with bilateral salpingectomy, sacrocolpopexy, midurethral sling, cystoscopy on 01/14/22.  Her symptoms include vaginal bulge and stress incontinence, and she was was found to have Stage III anterior, Stage II posterior, Stage I apical prolapse .  Simple CMG Interpretation: CMG showed normal sensation, and normal cystometric capacity. Findings positive for stress incontinence, negative for detrusor overactivity.   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   Family History  Problem Relation Age of Onset   Hypertension Mother    Heart disease Father    Breast cancer Other    Osteoporosis Maternal Grandmother    Ovarian cancer Cousin 70    Social History   Tobacco Use   Smoking status: Former   Smokeless tobacco: Never   Tobacco comments:    smoked for about 2 years in high school  Vaping Use   Vaping Use: Never used  Substance Use Topics   Alcohol use: Yes    Alcohol/week: 5.0 - 6.0 standard drinks of alcohol    Types: 5 - 6 Standard drinks or equivalent per week   Drug use: No     Review of Systems was negative for a full 10 system review  except as noted in the History of Present Illness.   Current Outpatient Medications:    Ascorbic Acid (VITAMIN C) 100 MG tablet, Take 100 mg by mouth daily., Disp: , Rfl:    doxycycline (VIBRA-TABS) 100 MG tablet, Take 1 tablet (100 mg total) by mouth daily. 2 tablets now, Disp: 2 tablet, Rfl: 0   Dulaglutide (TRULICITY) 6.31 SH/7.64YO SOPN, Inject 0.75 mg into the skin once a week., Disp: 4 mL, Rfl: 2   fluticasone (FLONASE) 50 MCG/ACT nasal spray, Place 2 sprays into both nostrils daily., Disp: 48 g, Rfl: 3   Milk Thistle 1000 MG CAPS, , Disp: , Rfl:    naproxen (NAPROSYN) 500 MG tablet, Take 1 tablet (500 mg total) by mouth 2 (two) times daily with a meal., Disp: 20 tablet, Rfl: 2   omeprazole (PRILOSEC OTC) 20 MG tablet, Take by mouth., Disp: , Rfl:    omeprazole (PRILOSEC) 20 MG capsule, TAKE 1 CAPSULE DAILY, Disp: 90 capsule, Rfl: 0   sertraline (ZOLOFT) 100 MG tablet, TAKE 1 TABLET BY MOUTH EVERY DAY, Disp: 90 tablet, Rfl: 0   Zinc Sulfate (ZINC 15 PO), Take by mouth., Disp: , Rfl:    Objective Vitals:   12/24/21 1354  BP: 109/70  Pulse: 80    Gen: NAD CV: S1 S2 RRR Lungs: Clear to auscultation bilaterally Abd: soft, nontender   Previous Pelvic Exam showed: POP-Q:  POP-Q   2                                            Aa   2                                           Ba   -3                                              C    5                                            Gh   5                                            Pb   9                                            tvl    0                                            Ap   0                                            Bp   -5                                              D       Assessment/ Plan  Assessment: The patient is a 66 y.o. year old scheduled to undergo Exam under anesthesia, robotic assisted total laparoscopic hysterectomy with bilateral salpingectomy, sacrocolpopexy, midurethral sling,  cystoscopy . Co-case will be with general surgery for concurrent hernia repair. Verbal consent was obtained for these procedures.  Plan: General Surgical Consent: The patient has previously been counseled on alternative treatments, and the decision by the patient and provider was to proceed with the procedure listed above.  For all procedures, there are risks of bleeding, infection, damage to surrounding organs including but not limited to bowel, bladder, blood vessels, ureters and nerves, and need for further surgery if an injury were to occur. These risks are all low with minimally invasive surgery.   There are risks of numbness and weakness at any body site or buttock/rectal pain.  It is possible that baseline pain can be worsened by surgery, either with or without mesh. If surgery is vaginal, there is also  a low risk of possible conversion to laparoscopy or open abdominal incision where indicated. Very rare risks include blood transfusion, blood clot, heart attack, pneumonia, or death.   There is also a risk of short-term postoperative urinary retention with need to use a catheter. About half of patients need to go home from surgery with a catheter, which is then later removed in the office. The risk of long-term need for a catheter is very low. There is also a risk of worsening of overactive bladder.   Sling: The effectiveness of a midurethral vaginal mesh sling is approximately 85%, and thus, there will be times when you may leak urine after surgery, especially if your bladder is full or if you have a strong cough. There is a balance between making the sling tight enough to treat your leakage but not too tight so that you have long-term difficulty emptying your bladder. A mesh sling will not directly treat overactive bladder/urge incontinence and may worsen it.  There is an FDA safety notification on vaginal mesh procedures for prolapse but NOT mesh slings. We have extensive experience and  training with mesh placement and we have close postoperative follow up to identify any potential complications from mesh. It is important to realize that this mesh is a permanent implant that cannot be easily removed. There are rare risks of mesh exposure (2-4%), pain with intercourse (0-7%), and infection (<1%). The risk of mesh exposure if more likely in a woman with risks for poor healing (prior radiation, poorly controlled diabetes, or immunocompromised). The risk of new or worsened chronic pain after mesh implant is more common in women with baseline chronic pain and/or poorly controlled anxiety or depression. Approximately 2-4% of patients will experience longer-term post-operative voiding dysfunction that may require surgical revision of the sling. We also reviewed that postoperatively, her stream may not be as strong as before surgery.    Prolapse (with or without mesh): Risk factors for surgical failure  include things that put pressure on your pelvis and the surgical repair, including obesity, chronic cough, and heavy lifting or straining (including lifting children or adults, straining on the toilet, or lifting heavy objects such as furniture or anything weighing >25 lbs. Risks of recurrence is 20-30% with vaginal native tissue repair and a less than 10% with sacrocolpopexy with mesh.    Sacrocolpopexy: Mesh implants may provide more prolapse support, but do have some unique risks to consider. It is important to understand that mesh is permanent and cannot be easily removed. Risks of abdominal sacrocolpopexy mesh include mesh exposure (~3-6%), painful intercourse (recent studies show lower rates after surgery compared to before, with ~5-8% risk of new onset), and very rare risks of bowel or bladder injury or infection (<1%). The risk of mesh exposure is more likely in a woman with risks for poor healing (prior radiation, poorly controlled diabetes, or immunocompromised). The risk of new or worsened  chronic pain after mesh implant is more common in women with baseline chronic pain and/or poorly controlled anxiety or depression. There is an FDA safety notification on vaginal mesh procedures for prolapse but NOT abdominal mesh procedures and therefore does not apply to your surgery. We have extensive experience and training with mesh placement and we have close postoperative follow up to identify any potential complications from mesh.    We discussed consent for blood products. Risks for blood transfusion include allergic reactions, other reactions that can affect different body organs and managed accordingly, transmission of infectious diseases such  as HIV or Hepatitis. However, the blood is screened. Patient consents for blood products.  Pre-operative instructions:  She was instructed to not take Aspirin/NSAIDs x 7days prior to surgery. Antibiotic prophylaxis was ordered as indicated.  Catheter use: Patient will go home with foley if needed after post-operative voiding trial.  Post-operative instructions:  She was provided with specific post-operative instructions, including precautions and signs/symptoms for which we would recommend contacting us, in addition to daytime and after-hours contact phone numbers. This was provided on a handout.   Post-operative medications: Prescriptions for motrin, tylenol, miralax, and oxycodone were sent to her pharmacy. Discussed using ibuprofen and tylenol on a schedule to limit use of narcotics.   Laboratory testing:  We will check labs: type and screen  Preoperative clearance:  She does not require surgical clearance.    Post-operative follow-up:  A post-operative appointment will be made for 6 weeks from the date of surgery. If she needs a post-operative nurse visit for a voiding trial, that will be set up after she leaves the hospital.    Patient will call the clinic or use MyChart should anything change or any new issues arise.   Jaquita Folds,  MD  Time spent: I spent 20 minutes dedicated to the care of this patient on the date of this encounter to include pre-visit review of records, face-to-face time with the patient and post visit documentation and ordering medication/ testing.

## 2021-12-25 ENCOUNTER — Encounter (HOSPITAL_BASED_OUTPATIENT_CLINIC_OR_DEPARTMENT_OTHER): Payer: Self-pay | Admitting: Obstetrics and Gynecology

## 2022-01-01 ENCOUNTER — Encounter: Payer: Self-pay | Admitting: Obstetrics and Gynecology

## 2022-01-01 NOTE — H&P (Signed)
[facility] Pre-Operative H&P  Subjective Chief Complaint: Evelyn Hayes presents for a preoperative encounter.   History of Present Illness: Evelyn Hayes is a 66 y.o. female who presents for preoperative visit.  She is scheduled to undergo Exam under anesthesia, robotic assisted total laparoscopic hysterectomy with bilateral salpingectomy, sacrocolpopexy, midurethral sling, cystoscopy on 01/14/22.  Her symptoms include vaginal bulge and stress incontinence, and she was was found to have Stage III anterior, Stage II posterior, Stage I apical prolapse .  Simple CMG Interpretation: CMG showed normal sensation, and normal cystometric capacity. Findings positive for stress incontinence, negative for detrusor overactivity.   Past Medical History:  Diagnosis Date   Anxiety    Arthritis    right knee   COPD (chronic obstructive pulmonary disease) ([place]) 08/13/2020   COPD GOLD stage 1, see 08/20/20 OV w/ Dr. Ottie Glazier, pulmonologist in [place].   CPAP (continuous positive airway pressure) dependence 05/2011   DOE (dyspnea on exertion) 02/24/2020   03/14/20 treadmill stress test showed ECG w/o evidence of ischemia or arrhythmia, 04/09/2020 echocardiogram LVEF 55 - 60%, 03/13/2020 Holter monitor rare PACs and occasional PVC, see cardiology OV dated 04/23/20 with Dr. Hassell Done in [place]. Also see 08/20/20 OV with pulmonolgy in Epic.   GERD (gastroesophageal reflux disease)    History of kidney stones    Knee cartilage, torn, right 2003   Laryngopharyngeal reflux 12/2005   Obesity    Palpitations 2021   see 04/23/20 cardiology OV note from Dr. Hassell Done in [place].   Prediabetes    11/14/21 A1c 6.1   Right inguinal hernia 2023   Sleep apnea    Patient uses CPAP as of 12/25/21.   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   Family History  Problem Relation Age of Onset   Hypertension Mother    Heart disease Father    Breast cancer Other    Osteoporosis Maternal Grandmother    Ovarian cancer Cousin 70    Social History   Tobacco Use   Smoking status: Former   Smokeless tobacco: Never   Tobacco comments:    smoked for about 2 years in high school  Vaping Use   Vaping Use: Never used  Substance Use Topics   Alcohol use: Yes    Alcohol/week: 5.0 - 6.0 standard drinks of alcohol    Types: 5 - 6 Standard drinks or equivalent per week   Drug use: No     Review of Systems was negative for a full 10 system review except as noted in the History of Present Illness.  No current facility-administered medications for this encounter.  Current Outpatient Medications:    acetaminophen (TYLENOL) 500 MG tablet, Take 1 tablet (500 mg total) by mouth every 6 (six) hours as needed (pain)., Disp: 30 tablet, Rfl: 0   Ascorbic Acid (VITAMIN C) 100 MG tablet, Take 100 mg by mouth daily., Disp: , Rfl:    doxycycline (VIBRA-TABS) 100 MG tablet, Take 1 tablet (100 mg total) by mouth daily. 2 tablets now, Disp: 2 tablet, Rfl: 0   Dulaglutide (TRULICITY) 4.09 WJ/1.9JY SOPN, Inject  0.75 mg into the skin once a week., Disp: 4 mL, Rfl: 2   fluticasone (FLONASE) 50 MCG/ACT nasal spray, Place 2 sprays into both nostrils daily., Disp: 48 g, Rfl: 3   ibuprofen (ADVIL) 600 MG tablet, Take 1 tablet (600 mg total) by mouth every 6 (six) hours as needed., Disp: 30 tablet, Rfl: 0   Milk Thistle 1000 MG CAPS, , Disp: , Rfl:    naproxen (NAPROSYN) 500 MG tablet, Take 1 tablet (500 mg total) by mouth 2 (two) times daily with a meal., Disp: 20 tablet, Rfl: 2   omeprazole (PRILOSEC OTC) 20 MG tablet, Take by mouth., Disp: , Rfl:    omeprazole (PRILOSEC) 20 MG capsule, TAKE 1 CAPSULE DAILY, Disp: 90  capsule, Rfl: 0   oxyCODONE (OXY IR/ROXICODONE) 5 MG immediate release tablet, Take 1 tablet (5 mg total) by mouth every 4 (four) hours as needed for severe pain., Disp: 15 tablet, Rfl: 0   polyethylene glycol powder (GLYCOLAX/MIRALAX) 17 GM/SCOOP powder, Take 17 g by mouth daily. Drink 17g (1 scoop) dissolved in water per day., Disp: 255 g, Rfl: 0   sertraline (ZOLOFT) 100 MG tablet, TAKE 1 TABLET BY MOUTH EVERY DAY, Disp: 90 tablet, Rfl: 0   Zinc Sulfate (ZINC 15 PO), Take by mouth., Disp: , Rfl:    Objective There were no vitals filed for this visit.   Gen: NAD CV: S1 S2 RRR Lungs: Clear to auscultation bilaterally Abd: soft, nontender   Previous Pelvic Exam showed: POP-Q:    POP-Q   2                                            Aa   2                                           Ba   -3                                              C    5                                            Gh   5                                            Pb   9                                            tvl    0                                            Ap   0  Bp   -5                                              D       Assessment/ Plan  The patient is a 66 y.o. year old with stage II POP and SUI scheduled to undergo Exam under anesthesia, robotic assisted total laparoscopic hysterectomy with bilateral salpingectomy, sacrocolpopexy, midurethral sling, cystoscopy . Co-case will be with general surgery for concurrent hernia repair.   Jaquita Folds, MD

## 2022-01-02 ENCOUNTER — Encounter (HOSPITAL_BASED_OUTPATIENT_CLINIC_OR_DEPARTMENT_OTHER): Payer: Self-pay | Admitting: Obstetrics and Gynecology

## 2022-01-02 ENCOUNTER — Other Ambulatory Visit: Payer: Self-pay

## 2022-01-02 NOTE — Progress Notes (Signed)
Your procedure is scheduled on Tuesday, 01/14/22.  Report to [facility]   Call this number if you have problems the morning of surgery  :[phone].   OUR ADDRESS IS [address].  WE ARE LOCATED IN [facility].  PLEASE BRING YOUR INSURANCE CARD AND PHOTO ID DAY OF SURGERY.  ONLY 2 PEOPLE ARE ALLOWED IN  WAITING  ROOM.                                      REMEMBER:  DO NOT EAT FOOD, CANDY GUM OR MINTS  AFTER MIDNIGHT THE NIGHT BEFORE YOUR SURGERY . YOU MAY HAVE CLEAR LIQUIDS FROM MIDNIGHT THE NIGHT BEFORE YOUR SURGERY UNTIL  4:30 AM. NO CLEAR LIQUIDS AFTER   4:30 AM DAY OF SURGERY.  YOU MAY  BRUSH YOUR TEETH MORNING OF SURGERY AND RINSE YOUR MOUTH OUT, NO CHEWING GUM CANDY OR MINTS.     CLEAR LIQUID DIET   Foods Allowed                                                                     Foods Excluded  Coffee and tea, regular and decaf                             liquids that you cannot  Plain Jell-O                                                                   see through such as: Fruit ices (not with fruit pulp)                                     milk, soups, orange juice  Plain  Popsicles                                    All solid food Carbonated beverages, regular and diet                                    Cranberry, grape and apple juices Sports drinks like Gatorade _____________________________________________________________________     TAKE THESE MEDICATIONS MORNING OF SURGERY:  Prilosec, Zoloft, and Flonase if needed    UP TO 4 VISITORS  MAY VISIT IN THE EXTENDED RECOVERY ROOM UNTIL 800 PM ONLY.  ONE  VISITOR AGE 5 AND OVER MAY SPEND THE NIGHT AND MUST BE IN EXTENDED RECOVERY ROOM NO LATER THAN 800 PM . YOUR DISCHARGE TIME AFTER YOU SPEND THE NIGHT IS 900 AM THE MORNING AFTER YOUR SURGERY.  YOU MAY PACK A SMALL OVERNIGHT BAG WITH  TOILETRIES FOR YOUR OVERNIGHT STAY IF YOU WISH.  YOUR  PRESCRIPTION MEDICATIONS WILL BE PROVIDED DURING [facility].                                      DO NOT WEAR JEWERLY, MAKE UP. DO NOT WEAR LOTIONS, POWDERS, PERFUMES OR NAIL POLISH ON YOUR FINGERNAILS. TOENAIL POLISH IS OK TO WEAR. DO NOT SHAVE FOR 48 HOURS PRIOR TO DAY OF SURGERY. MEN MAY SHAVE FACE AND NECK. CONTACTS, GLASSES, OR DENTURES MAY NOT BE WORN TO SURGERY.  REMEMBER: NO SMOKING, DRUGS OR ALCOHOL FOR 24 HOURS BEFORE YOUR SURGERY.                                    [facility] IS NOT RESPONSIBLE  FOR ANY BELONGINGS.                                                                    Marland Kitchen           [facility] - Preparing for Surgery Before surgery, you can play an important role.  Because skin is not sterile, your skin needs to be as free of germs as possible.  You can reduce the number of germs on your skin by washing with CHG (chlorahexidine gluconate) soap before surgery.  CHG is an antiseptic cleaner which kills germs and bonds with the skin to continue killing germs even after washing. Please DO NOT use if you have an allergy to CHG or antibacterial soaps.  If your skin becomes reddened/irritated stop using the CHG and inform your nurse when you arrive at Short Stay. Do not shave (including legs and underarms) for at least 48 hours prior to the first CHG shower.  You may shave your face/neck. Please follow these instructions carefully:  1.  Shower with CHG Soap the night before surgery and the  morning of Surgery.  2.  If you choose to wash your hair, wash your hair first as usual with your  normal  shampoo.  3.  After you shampoo, rinse your hair and body thoroughly to remove the  shampoo.                            4.  Use CHG as you would any other liquid soap.  You can apply chg directly  to the skin and wash , please wash your belly button thoroughly with chg soap provided night before and morning of your surgery.                     Gently with a scrungie or clean  washcloth.  5.  Apply the CHG Soap to your body ONLY FROM THE NECK DOWN.   Do not use on face/ open                           Wound or open sores. Avoid contact with eyes, ears mouth and genitals (private parts).  Wash face,  Genitals (private parts) with your normal soap.             6.  Wash thoroughly, paying special attention to the area where your surgery  will be performed.  7.  Thoroughly rinse your body with warm water from the neck down.  8.  DO NOT shower/wash with your normal soap after using and rinsing off  the CHG Soap.                9.  Pat yourself dry with a clean towel.            10.  Wear clean pajamas.            11.  Place clean sheets on your bed the night of your first shower and do not  sleep with pets. Day of Surgery : Do not apply any lotions/deodorants the morning of surgery.  Please wear clean clothes to the hospital/surgery center.  IF YOU HAVE ANY SKIN IRRITATION OR PROBLEMS WITH THE SURGICAL SOAP, PLEASE GET A BAR OF GOLD DIAL SOAP AND SHOWER THE NIGHT BEFORE YOUR SURGERY AND THE MORNING OF YOUR SURGERY. PLEASE LET THE NURSE KNOW MORNING OF YOUR SURGERY IF YOU HAD ANY PROBLEMS WITH THE SURGICAL SOAP.   ________________________________________________________________________                                                        QUESTIONS [facility] PRE OP NURSE PHONE [phone].

## 2022-01-02 NOTE — Progress Notes (Addendum)
Spoke w/ via phone for pre-op interview---[facility] needs dos---- none          Lab results------01/09/22 lab appt for CBC, type & screen COVID test -----patient states asymptomatic no test needed Arrive at -------0530 on Tuesday, 01/14/22 NPO after MN NO Solid Food.  Clear liquids from MN until---0430 Med rec completed Medications to take morning of surgery -----Prilosec, Zoloft, Flonase prn Diabetic medication -----Do not take Trulicity on the morning of surgery. Patient instructed no nail polish to be worn day of surgery Patient instructed to bring photo id and insurance card day of surgery Patient aware to have Driver (ride ) / caregiver    for 24 hours after surgery - husband, Charles Patient Special Instructions -----Extended stay instructions given. Pre-Op special Istructions -----none Patient verbalized understanding of instructions that were given at this phone interview. Patient denies shortness of breath, chest pain, fever, cough at this phone interview.   See EGD note from Dr. Carol Ada in chart concerning a laryngospasm on 02/01/19.

## 2022-01-08 ENCOUNTER — Encounter (HOSPITAL_BASED_OUTPATIENT_CLINIC_OR_DEPARTMENT_OTHER): Payer: Self-pay | Admitting: Obstetrics and Gynecology

## 2022-01-08 ENCOUNTER — Other Ambulatory Visit: Payer: Self-pay | Admitting: Family Medicine

## 2022-01-08 DIAGNOSIS — K219 Gastro-esophageal reflux disease without esophagitis: Secondary | ICD-10-CM

## 2022-01-09 ENCOUNTER — Encounter (HOSPITAL_COMMUNITY)
Admission: RE | Admit: 2022-01-09 | Discharge: 2022-01-09 | Disposition: A | Discharge status: Home / Self Care | Payer: BC Managed Care – PPO | Source: Ambulatory Visit | Attending: Obstetrics and Gynecology | Admitting: Obstetrics and Gynecology

## 2022-01-09 DIAGNOSIS — Z01812 Encounter for preprocedural laboratory examination: Secondary | ICD-10-CM | POA: Diagnosis present

## 2022-01-09 DIAGNOSIS — Z01818 Encounter for other preprocedural examination: Secondary | ICD-10-CM

## 2022-01-09 LAB — CBC
HCT: 41.2 % (ref 36.0–46.0)
Hemoglobin: 13.6 g/dL (ref 12.0–15.0)
MCH: 29.2 pg (ref 26.0–34.0)
MCHC: 33 g/dL (ref 30.0–36.0)
MCV: 88.4 fL (ref 80.0–100.0)
Platelets: 187 10*3/uL (ref 150–400)
RBC: 4.66 MIL/uL (ref 3.87–5.11)
RDW: 12.6 % (ref 11.5–15.5)
WBC: 6.5 10*3/uL (ref 4.0–10.5)
nRBC: 0 % (ref 0.0–0.2)

## 2022-01-13 ENCOUNTER — Ambulatory Visit: Payer: Self-pay | Admitting: Surgery

## 2022-01-13 NOTE — Anesthesia Preprocedure Evaluation (Signed)
Anesthesia Evaluation  Patient identified by MRN, date of birth, ID band Patient awake    Reviewed: Allergy & Precautions, NPO status , Patient's Chart, lab work & pertinent test results  Airway Mallampati: III  TM Distance: >3 FB Neck ROM: Full    Dental  (+) Teeth Intact, Dental Advisory Given,    Pulmonary sleep apnea and Continuous Positive Airway Pressure Ventilation , COPD, former smoker,    Pulmonary exam normal breath sounds clear to auscultation       Cardiovascular negative cardio ROS Normal cardiovascular exam Rhythm:Regular Rate:Normal     Neuro/Psych PSYCHIATRIC DISORDERS Anxiety negative neurological ROS     GI/Hepatic Neg liver ROS, GERD  Medicated,  Endo/Other  diabetes, Type 2, Oral Hypoglycemic AgentsMorbid obesity  Renal/GU negative Renal ROS     Musculoskeletal  (+) Arthritis ,   Abdominal   Peds  Hematology negative hematology ROS (+)   Anesthesia Other Findings   Reproductive/Obstetrics                           Anesthesia Physical Anesthesia Plan  ASA: 3  Anesthesia Plan: General   Post-op Pain Management: Tylenol PO (pre-op)* and Gabapentin PO (pre-op)*   Induction: Intravenous  PONV Risk Score and Plan: 4 or greater and Midazolam, Dexamethasone, Ondansetron and Propofol infusion  Airway Management Planned: Oral ETT  Additional Equipment:   Intra-op Plan:   Post-operative Plan: Extubation in OR  Informed Consent: I have reviewed the patients History and Physical, chart, labs and discussed the procedure including the risks, benefits and alternatives for the proposed anesthesia with the patient or authorized representative who has indicated his/her understanding and acceptance.     Dental advisory given  Plan Discussed with: CRNA  Anesthesia Plan Comments: (2nd PIV after induction)       Anesthesia Quick Evaluation

## 2022-01-14 ENCOUNTER — Ambulatory Visit (HOSPITAL_BASED_OUTPATIENT_CLINIC_OR_DEPARTMENT_OTHER): Payer: BC Managed Care – PPO | Admitting: Anesthesiology

## 2022-01-14 ENCOUNTER — Encounter (HOSPITAL_BASED_OUTPATIENT_CLINIC_OR_DEPARTMENT_OTHER)
Admission: RE | Disposition: A | Discharge status: Home / Self Care | Payer: Self-pay | Source: Home / Self Care | Attending: Obstetrics and Gynecology

## 2022-01-14 ENCOUNTER — Other Ambulatory Visit: Payer: Self-pay

## 2022-01-14 ENCOUNTER — Ambulatory Visit (HOSPITAL_BASED_OUTPATIENT_CLINIC_OR_DEPARTMENT_OTHER)
Admission: RE | Admit: 2022-01-14 | Discharge: 2022-01-15 | Disposition: A | Discharge status: Home / Self Care | Payer: BC Managed Care – PPO | Attending: Obstetrics and Gynecology | Admitting: Obstetrics and Gynecology

## 2022-01-14 ENCOUNTER — Encounter (HOSPITAL_BASED_OUTPATIENT_CLINIC_OR_DEPARTMENT_OTHER): Payer: Self-pay | Admitting: Obstetrics and Gynecology

## 2022-01-14 ENCOUNTER — Ambulatory Visit (HOSPITAL_COMMUNITY): Payer: BC Managed Care – PPO

## 2022-01-14 DIAGNOSIS — K4090 Unilateral inguinal hernia, without obstruction or gangrene, not specified as recurrent: Secondary | ICD-10-CM | POA: Diagnosis not present

## 2022-01-14 DIAGNOSIS — Z87891 Personal history of nicotine dependence: Secondary | ICD-10-CM | POA: Diagnosis not present

## 2022-01-14 DIAGNOSIS — N393 Stress incontinence (female) (male): Secondary | ICD-10-CM | POA: Insufficient documentation

## 2022-01-14 DIAGNOSIS — M1990 Unspecified osteoarthritis, unspecified site: Secondary | ICD-10-CM | POA: Diagnosis not present

## 2022-01-14 DIAGNOSIS — Z6841 Body Mass Index (BMI) 40.0 and over, adult: Secondary | ICD-10-CM | POA: Insufficient documentation

## 2022-01-14 DIAGNOSIS — N888 Other specified noninflammatory disorders of cervix uteri: Secondary | ICD-10-CM | POA: Diagnosis not present

## 2022-01-14 DIAGNOSIS — N816 Rectocele: Secondary | ICD-10-CM

## 2022-01-14 DIAGNOSIS — G4730 Sleep apnea, unspecified: Secondary | ICD-10-CM | POA: Insufficient documentation

## 2022-01-14 DIAGNOSIS — E119 Type 2 diabetes mellitus without complications: Secondary | ICD-10-CM | POA: Insufficient documentation

## 2022-01-14 DIAGNOSIS — N812 Incomplete uterovaginal prolapse: Secondary | ICD-10-CM | POA: Diagnosis not present

## 2022-01-14 DIAGNOSIS — Z7984 Long term (current) use of oral hypoglycemic drugs: Secondary | ICD-10-CM | POA: Diagnosis not present

## 2022-01-14 DIAGNOSIS — F419 Anxiety disorder, unspecified: Secondary | ICD-10-CM | POA: Insufficient documentation

## 2022-01-14 DIAGNOSIS — N8110 Cystocele, unspecified: Secondary | ICD-10-CM

## 2022-01-14 DIAGNOSIS — K219 Gastro-esophageal reflux disease without esophagitis: Secondary | ICD-10-CM | POA: Diagnosis not present

## 2022-01-14 DIAGNOSIS — J449 Chronic obstructive pulmonary disease, unspecified: Secondary | ICD-10-CM | POA: Diagnosis not present

## 2022-01-14 DIAGNOSIS — N72 Inflammatory disease of cervix uteri: Secondary | ICD-10-CM | POA: Diagnosis not present

## 2022-01-14 DIAGNOSIS — Z01818 Encounter for other preprocedural examination: Secondary | ICD-10-CM

## 2022-01-14 DIAGNOSIS — K429 Umbilical hernia without obstruction or gangrene: Secondary | ICD-10-CM | POA: Diagnosis not present

## 2022-01-14 DIAGNOSIS — N879 Dysplasia of cervix uteri, unspecified: Secondary | ICD-10-CM | POA: Diagnosis not present

## 2022-01-14 HISTORY — DX: Prediabetes: R73.03

## 2022-01-14 HISTORY — PX: CYSTOSCOPY: SHX5120

## 2022-01-14 HISTORY — DX: Unspecified osteoarthritis, unspecified site: M19.90

## 2022-01-14 HISTORY — PX: XI ROBOTIC ASSISTED TOTAL HYSTERECTOMY WITH SACROCOLPOPEXY: SHX6825

## 2022-01-14 HISTORY — DX: Personal history of urinary calculi: Z87.442

## 2022-01-14 HISTORY — DX: Sleep apnea, unspecified: G47.30

## 2022-01-14 HISTORY — DX: Asymptomatic varicose veins of left lower extremity: I83.92

## 2022-01-14 HISTORY — DX: Obesity, unspecified: E66.9

## 2022-01-14 HISTORY — PX: BLADDER SUSPENSION: SHX72

## 2022-01-14 LAB — TYPE AND SCREEN
ABO/RH(D): A POS
Antibody Screen: NEGATIVE

## 2022-01-14 LAB — ABO/RH: ABO/RH(D): A POS

## 2022-01-14 LAB — GLUCOSE, CAPILLARY
Glucose-Capillary: 101 mg/dL — ABNORMAL HIGH (ref 70–99)
Glucose-Capillary: 189 mg/dL — ABNORMAL HIGH (ref 70–99)

## 2022-01-14 SURGERY — HYSTERECTOMY, TOTAL, ROBOT-ASSISTED, WITH SACROCOLPOPEXY
Anesthesia: General | Site: Vagina | Laterality: Right

## 2022-01-14 MED ORDER — ROCURONIUM BROMIDE 10 MG/ML (PF) SYRINGE
PREFILLED_SYRINGE | INTRAVENOUS | Status: AC
  Filled 2022-01-14: qty 10

## 2022-01-14 MED ORDER — ONDANSETRON HCL 4 MG/2ML IJ SOLN: 4.0000 mg | Freq: Four times a day (QID) | INTRAMUSCULAR | Status: DC | PRN

## 2022-01-14 MED ORDER — FENTANYL CITRATE (PF) 100 MCG/2ML IJ SOLN
INTRAMUSCULAR | Status: AC
  Filled 2022-01-14: qty 2

## 2022-01-14 MED ORDER — ONDANSETRON HCL 4 MG/2ML IJ SOLN
INTRAMUSCULAR | Status: DC | PRN
  Administered 2022-01-14: 4 mg via INTRAVENOUS

## 2022-01-14 MED ORDER — PHENAZOPYRIDINE HCL 100 MG PO TABS
200.0000 mg | ORAL_TABLET | ORAL | Status: AC
  Administered 2022-01-14: 200 mg via ORAL

## 2022-01-14 MED ORDER — WATER FOR IRRIGATION, STERILE IR SOLN
Status: DC | PRN
  Administered 2022-01-14: 500 mL

## 2022-01-14 MED ORDER — DEXAMETHASONE SODIUM PHOSPHATE 10 MG/ML IJ SOLN
INTRAMUSCULAR | Status: DC | PRN
  Administered 2022-01-14: 5 mg via INTRAVENOUS

## 2022-01-14 MED ORDER — CEFAZOLIN SODIUM-DEXTROSE 2-4 GM/100ML-% IV SOLN: 2.0000 g | INTRAVENOUS | Status: AC

## 2022-01-14 MED ORDER — GABAPENTIN 300 MG PO CAPS
300.0000 mg | ORAL_CAPSULE | ORAL | Status: AC
  Administered 2022-01-14: 300 mg via ORAL

## 2022-01-14 MED ORDER — PROPOFOL 10 MG/ML IV BOLUS
INTRAVENOUS | Status: DC | PRN
  Administered 2022-01-14: 160 mg via INTRAVENOUS

## 2022-01-14 MED ORDER — LIDOCAINE HCL (PF) 2 % IJ SOLN
INTRAMUSCULAR | Status: AC
  Filled 2022-01-14: qty 10

## 2022-01-14 MED ORDER — BUPIVACAINE HCL (PF) 0.25 % IJ SOLN
INTRAMUSCULAR | Status: DC | PRN
  Administered 2022-01-14: 20 mL

## 2022-01-14 MED ORDER — ONDANSETRON HCL 4 MG/2ML IJ SOLN: 4.0000 mg | Freq: Once | INTRAMUSCULAR | Status: DC | PRN

## 2022-01-14 MED ORDER — SURGIFLO WITH THROMBIN (HEMOSTATIC MATRIX KIT) OPTIME
TOPICAL | Status: DC | PRN
  Administered 2022-01-14: 1 via TOPICAL

## 2022-01-14 MED ORDER — OXYCODONE HCL 5 MG PO TABS
ORAL_TABLET | ORAL | Status: AC
  Filled 2022-01-14: qty 1

## 2022-01-14 MED ORDER — FENTANYL CITRATE (PF) 100 MCG/2ML IJ SOLN
25.0000 ug | INTRAMUSCULAR | Status: DC | PRN
  Administered 2022-01-14 (×2): 25 ug via INTRAVENOUS

## 2022-01-14 MED ORDER — GABAPENTIN 300 MG PO CAPS: 300.0000 mg | ORAL_CAPSULE | ORAL | Status: AC

## 2022-01-14 MED ORDER — LIDOCAINE-EPINEPHRINE 1 %-1:100000 IJ SOLN
INTRAMUSCULAR | Status: DC | PRN
  Administered 2022-01-14: 7 mL

## 2022-01-14 MED ORDER — ACETAMINOPHEN 500 MG PO TABS
ORAL_TABLET | ORAL | Status: AC
  Filled 2022-01-14: qty 2

## 2022-01-14 MED ORDER — SUGAMMADEX SODIUM 200 MG/2ML IV SOLN
INTRAVENOUS | Status: DC | PRN
  Administered 2022-01-14: 200 mg via INTRAVENOUS

## 2022-01-14 MED ORDER — ENOXAPARIN SODIUM 40 MG/0.4ML IJ SOSY
PREFILLED_SYRINGE | INTRAMUSCULAR | Status: AC
  Filled 2022-01-14: qty 0.4

## 2022-01-14 MED ORDER — PHENYLEPHRINE HCL (PRESSORS) 10 MG/ML IV SOLN
INTRAVENOUS | Status: DC | PRN
  Administered 2022-01-14: 80 ug via INTRAVENOUS

## 2022-01-14 MED ORDER — SCOPOLAMINE 1 MG/3DAYS TD PT72
MEDICATED_PATCH | TRANSDERMAL | Status: AC
  Filled 2022-01-14: qty 1

## 2022-01-14 MED ORDER — LIDOCAINE HCL (PF) 2 % IJ SOLN
INTRAMUSCULAR | Status: AC
  Filled 2022-01-14: qty 5

## 2022-01-14 MED ORDER — KETOROLAC TROMETHAMINE 30 MG/ML IJ SOLN
INTRAMUSCULAR | Status: AC
  Filled 2022-01-14: qty 1

## 2022-01-14 MED ORDER — SCOPOLAMINE 1 MG/3DAYS TD PT72
MEDICATED_PATCH | TRANSDERMAL | Status: DC | PRN
  Administered 2022-01-14: 1 via TRANSDERMAL

## 2022-01-14 MED ORDER — ACETAMINOPHEN 325 MG PO TABS
650.0000 mg | ORAL_TABLET | ORAL | Status: DC | PRN
  Administered 2022-01-14 – 2022-01-15 (×2): 650 mg via ORAL

## 2022-01-14 MED ORDER — ROCURONIUM BROMIDE 10 MG/ML (PF) SYRINGE
PREFILLED_SYRINGE | INTRAVENOUS | Status: DC | PRN
  Administered 2022-01-14: 60 mg via INTRAVENOUS
  Administered 2022-01-14 (×4): 10 mg via INTRAVENOUS

## 2022-01-14 MED ORDER — SIMETHICONE 80 MG PO CHEW: 80.0000 mg | CHEWABLE_TABLET | Freq: Four times a day (QID) | ORAL | Status: DC | PRN

## 2022-01-14 MED ORDER — DEXAMETHASONE SODIUM PHOSPHATE 10 MG/ML IJ SOLN
INTRAMUSCULAR | Status: AC
  Filled 2022-01-14: qty 1

## 2022-01-14 MED ORDER — LACTATED RINGERS IV SOLN: INTRAVENOUS | Status: DC

## 2022-01-14 MED ORDER — SODIUM CHLORIDE 0.9 % IR SOLN
Status: DC | PRN
  Administered 2022-01-14: 1000 mL

## 2022-01-14 MED ORDER — FENTANYL CITRATE (PF) 100 MCG/2ML IJ SOLN
INTRAMUSCULAR | Status: DC | PRN
  Administered 2022-01-14: 50 ug via INTRAVENOUS
  Administered 2022-01-14: 25 ug via INTRAVENOUS
  Administered 2022-01-14 (×2): 50 ug via INTRAVENOUS
  Administered 2022-01-14: 25 ug via INTRAVENOUS

## 2022-01-14 MED ORDER — BUPIVACAINE LIPOSOME 1.3 % IJ SUSP: 20.0000 mL | Freq: Once | INTRAMUSCULAR | Status: DC

## 2022-01-14 MED ORDER — KETOROLAC TROMETHAMINE 30 MG/ML IJ SOLN
INTRAMUSCULAR | Status: DC | PRN
  Administered 2022-01-14: 30 mg via INTRAVENOUS

## 2022-01-14 MED ORDER — LIDOCAINE 2% (20 MG/ML) 5 ML SYRINGE
INTRAMUSCULAR | Status: DC | PRN
  Administered 2022-01-14: 1.5 mg/kg/h via INTRAVENOUS
  Administered 2022-01-14: 80 mg via INTRAVENOUS

## 2022-01-14 MED ORDER — GABAPENTIN 300 MG PO CAPS
ORAL_CAPSULE | ORAL | Status: AC
  Filled 2022-01-14: qty 1

## 2022-01-14 MED ORDER — CEFAZOLIN SODIUM-DEXTROSE 2-4 GM/100ML-% IV SOLN
2.0000 g | INTRAVENOUS | Status: AC
  Administered 2022-01-14 (×2): 2 g via INTRAVENOUS

## 2022-01-14 MED ORDER — CEFAZOLIN SODIUM-DEXTROSE 2-4 GM/100ML-% IV SOLN
INTRAVENOUS | Status: AC
  Filled 2022-01-14: qty 100

## 2022-01-14 MED ORDER — ONDANSETRON HCL 4 MG/2ML IJ SOLN
INTRAMUSCULAR | Status: AC
  Filled 2022-01-14: qty 2

## 2022-01-14 MED ORDER — OXYCODONE HCL 5 MG PO TABS
ORAL_TABLET | ORAL | Status: AC
  Filled 2022-01-14: qty 2

## 2022-01-14 MED ORDER — BUPIVACAINE-EPINEPHRINE 0.25% -1:200000 IJ SOLN: INTRAMUSCULAR | Status: DC | PRN

## 2022-01-14 MED ORDER — ACETAMINOPHEN 325 MG PO TABS
ORAL_TABLET | ORAL | Status: AC
  Filled 2022-01-14: qty 2

## 2022-01-14 MED ORDER — CHLORHEXIDINE GLUCONATE CLOTH 2 % EX PADS: 6.0000 | MEDICATED_PAD | Freq: Once | CUTANEOUS | Status: DC

## 2022-01-14 MED ORDER — PROPOFOL 10 MG/ML IV BOLUS
INTRAVENOUS | Status: AC
  Filled 2022-01-14: qty 20

## 2022-01-14 MED ORDER — POVIDONE-IODINE 10 % EX SWAB: 2.0000 | Freq: Once | CUTANEOUS | Status: DC

## 2022-01-14 MED ORDER — ENOXAPARIN SODIUM 40 MG/0.4ML IJ SOSY
40.0000 mg | PREFILLED_SYRINGE | INTRAMUSCULAR | Status: AC
  Administered 2022-01-14: 40 mg via SUBCUTANEOUS

## 2022-01-14 MED ORDER — ACETAMINOPHEN 500 MG PO TABS
1000.0000 mg | ORAL_TABLET | ORAL | Status: AC
  Administered 2022-01-14: 1000 mg via ORAL

## 2022-01-14 MED ORDER — ACETAMINOPHEN 500 MG PO TABS: 1000.0000 mg | ORAL_TABLET | ORAL | Status: AC

## 2022-01-14 MED ORDER — ONDANSETRON HCL 4 MG PO TABS: 4.0000 mg | ORAL_TABLET | Freq: Four times a day (QID) | ORAL | Status: DC | PRN

## 2022-01-14 MED ORDER — MIDAZOLAM HCL 2 MG/2ML IJ SOLN
INTRAMUSCULAR | Status: AC
  Filled 2022-01-14: qty 2

## 2022-01-14 MED ORDER — OXYCODONE HCL 5 MG PO TABS
5.0000 mg | ORAL_TABLET | ORAL | Status: DC | PRN
  Administered 2022-01-14 (×2): 10 mg via ORAL
  Administered 2022-01-14: 5 mg via ORAL
  Administered 2022-01-15 (×2): 10 mg via ORAL

## 2022-01-14 MED ORDER — MIDAZOLAM HCL 2 MG/2ML IJ SOLN
INTRAMUSCULAR | Status: DC | PRN
  Administered 2022-01-14: 2 mg via INTRAVENOUS

## 2022-01-14 MED ORDER — PHENAZOPYRIDINE HCL 100 MG PO TABS
ORAL_TABLET | ORAL | Status: AC
  Filled 2022-01-14: qty 2

## 2022-01-14 SURGICAL SUPPLY — 116 items
ADH SKN CLS APL DERMABOND .7 (GAUZE/BANDAGES/DRESSINGS) ×5
AGENT HMST KT MTR STRL THRMB (HEMOSTASIS)
APL ESCP 34 STRL LF DISP (HEMOSTASIS) ×5
APL PRP STRL LF DISP 70% ISPRP (MISCELLANEOUS) ×10
APL SWBSTK 6 STRL LF DISP (MISCELLANEOUS) ×10
APPLICATOR COTTON TIP 6 STRL (MISCELLANEOUS) ×10 IMPLANT
APPLICATOR COTTON TIP 6IN STRL (MISCELLANEOUS) ×12
APPLICATOR SURGIFLO ENDO (HEMOSTASIS) ×1 IMPLANT
BLADE CLIPPER SENSICLIP SURGIC (BLADE) ×6 IMPLANT
BLADE SURG 15 STRL LF DISP TIS (BLADE) ×5 IMPLANT
BLADE SURG 15 STRL SS (BLADE) ×6
BLADE SURG SZ11 CARB STEEL (BLADE) ×6 IMPLANT
CATH FOLEY 3WAY  5CC 16FR (CATHETERS) ×6
CATH FOLEY 3WAY 5CC 16FR (CATHETERS) ×5 IMPLANT
CHLORAPREP W/TINT 26 (MISCELLANEOUS) ×12 IMPLANT
COVER BACK TABLE 60X90IN (DRAPES) ×6 IMPLANT
COVER SURGICAL LIGHT HANDLE (MISCELLANEOUS) ×6 IMPLANT
COVER TIP SHEARS 8 DVNC (MISCELLANEOUS) ×10 IMPLANT
COVER TIP SHEARS 8MM DA VINCI (MISCELLANEOUS) ×12
DECANTER SPIKE VIAL GLASS SM (MISCELLANEOUS) ×18 IMPLANT
DEFOGGER SCOPE WARMER CLEARIFY (MISCELLANEOUS) ×6 IMPLANT
DERMABOND ADVANCED (GAUZE/BANDAGES/DRESSINGS) ×1
DERMABOND ADVANCED .7 DNX12 (GAUZE/BANDAGES/DRESSINGS) ×5 IMPLANT
DEVICE CAPIO SLIM SINGLE (INSTRUMENTS) IMPLANT
DILATOR CANAL MILEX (MISCELLANEOUS) ×1 IMPLANT
DRAPE ARM DVNC X/XI (DISPOSABLE) ×40 IMPLANT
DRAPE COLUMN DVNC XI (DISPOSABLE) ×10 IMPLANT
DRAPE DA VINCI XI ARM (DISPOSABLE) ×48
DRAPE DA VINCI XI COLUMN (DISPOSABLE) ×12
DRAPE SHEET LG 3/4 BI-LAMINATE (DRAPES) ×6 IMPLANT
DRAPE UTILITY XL STRL (DRAPES) ×6 IMPLANT
ELECT REM PT RETURN 15FT ADLT (MISCELLANEOUS) ×6 IMPLANT
ELECT REM PT RETURN 9FT ADLT (ELECTROSURGICAL) ×6
ELECTRODE REM PT RTRN 9FT ADLT (ELECTROSURGICAL) ×5 IMPLANT
GAUZE 4X4 16PLY ~~LOC~~+RFID DBL (SPONGE) ×19 IMPLANT
GLOVE BIO SURGEON STRL SZ 6 (GLOVE) ×30 IMPLANT
GLOVE BIOGEL PI IND STRL 6.5 (GLOVE) ×20 IMPLANT
GLOVE BIOGEL PI IND STRL 7.0 (GLOVE) ×10 IMPLANT
GLOVE BIOGEL PI INDICATOR 6.5 (GLOVE) ×4
GLOVE BIOGEL PI INDICATOR 7.0 (GLOVE) ×2
GLOVE ECLIPSE 6.0 STRL STRAW (GLOVE) ×6 IMPLANT
GLOVE INDICATOR 6.5 STRL GRN (GLOVE) ×12 IMPLANT
GOWN STRL REUS W/TWL LRG LVL3 (GOWN DISPOSABLE) ×18 IMPLANT
GOWN STRL REUS W/TWL XL LVL3 (GOWN DISPOSABLE) ×6 IMPLANT
GRASPER SUT TROCAR 14GX15 (MISCELLANEOUS) ×1 IMPLANT
HIBICLENS CHG 4% 4OZ BTL (MISCELLANEOUS) ×6 IMPLANT
HOLDER FOLEY CATH W/STRAP (MISCELLANEOUS) ×6 IMPLANT
IRRIG SUCT STRYKERFLOW 2 WTIP (MISCELLANEOUS) ×6
IRRIGATION SUCT STRKRFLW 2 WTP (MISCELLANEOUS) ×5 IMPLANT
KIT TURNOVER CYSTO (KITS) ×12 IMPLANT
LEGGING LITHOTOMY PAIR STRL (DRAPES) ×6 IMPLANT
MANIFOLD NEPTUNE II (INSTRUMENTS) ×6 IMPLANT
MANIPULATOR ADVINCU DEL 2.5 PL (MISCELLANEOUS) IMPLANT
MANIPULATOR ADVINCU DEL 3.0 PL (MISCELLANEOUS) IMPLANT
MANIPULATOR ADVINCU DEL 3.5 PL (MISCELLANEOUS) ×1 IMPLANT
MANIPULATOR ADVINCU DEL 4.0 PL (MISCELLANEOUS) IMPLANT
MESH 3DMAX 4X6 RT LRG (Mesh General) ×1 IMPLANT
MESH VERTESSA LITE -Y 2X4X3 (Mesh General) ×6 IMPLANT
NEEDLE HYPO 22GX1.5 SAFETY (NEEDLE) ×12 IMPLANT
NEEDLE INSUFFLATION 120MM (ENDOMECHANICALS) ×6 IMPLANT
NS IRRIG 1000ML POUR BTL (IV SOLUTION) ×6 IMPLANT
OBTURATOR OPTICAL STANDARD 8MM (TROCAR) ×6
OBTURATOR OPTICAL STND 8 DVNC (TROCAR) ×5
OBTURATOR OPTICALSTD 8 DVNC (TROCAR) ×5 IMPLANT
PACK BASIN DAY SURGERY FS (CUSTOM PROCEDURE TRAY) ×6 IMPLANT
PACK CARDIOVASCULAR III (CUSTOM PROCEDURE TRAY) ×6 IMPLANT
PACK CYSTO (CUSTOM PROCEDURE TRAY) ×6 IMPLANT
PACK ROBOT WH (CUSTOM PROCEDURE TRAY) ×6 IMPLANT
PACK ROBOTIC GOWN (GOWN DISPOSABLE) ×6 IMPLANT
PACK VAGINAL WOMENS (CUSTOM PROCEDURE TRAY) ×6 IMPLANT
PAD OB MATERNITY 4.3X12.25 (PERSONAL CARE ITEMS) ×6 IMPLANT
PAD POSITIONING PINK XL (MISCELLANEOUS) ×12 IMPLANT
PAD PREP 24X48 CUFFED NSTRL (MISCELLANEOUS) ×6 IMPLANT
POUCH LAPAROSCOPIC INSTRUMENT (MISCELLANEOUS) IMPLANT
PROTECTOR NERVE ULNAR (MISCELLANEOUS) ×6 IMPLANT
RETRACTOR LONE STAR DISPOSABLE (INSTRUMENTS) ×6 IMPLANT
RETRACTOR STAY HOOK 5MM (MISCELLANEOUS) ×6 IMPLANT
SEAL CANN UNIV 5-8 DVNC XI (MISCELLANEOUS) ×40 IMPLANT
SEAL XI 5MM-8MM UNIVERSAL (MISCELLANEOUS) ×54
SEALER VESSEL DA VINCI XI (MISCELLANEOUS) ×6
SEALER VESSEL EXT DVNC XI (MISCELLANEOUS) IMPLANT
SET IRRIG TUBING LAPAROSCOPIC (IRRIGATION / IRRIGATOR) IMPLANT
SET IRRIG Y TYPE TUR BLADDER L (SET/KITS/TRAYS/PACK) ×6 IMPLANT
SET TUBE SMOKE EVAC HIGH FLOW (TUBING) ×12 IMPLANT
SOL ANTI FOG 6CC (MISCELLANEOUS) ×5 IMPLANT
SOLUTION ANTI FOG 6CC (MISCELLANEOUS) ×1
SOLUTION ELECTROLUBE (MISCELLANEOUS) ×6 IMPLANT
SPONGE T-LAP 18X18 ~~LOC~~+RFID (SPONGE) ×6 IMPLANT
SPONGE T-LAP 4X18 ~~LOC~~+RFID (SPONGE) IMPLANT
SUCTION FRAZIER HANDLE 10FR (MISCELLANEOUS) ×6
SUCTION TUBE FRAZIER 10FR DISP (MISCELLANEOUS) ×5 IMPLANT
SURGIFLO W/THROMBIN 8M KIT (HEMOSTASIS) IMPLANT
SUT ABS MONO DBL WITH NDL 48IN (SUTURE) IMPLANT
SUT ETHIBOND 0 (SUTURE) ×1 IMPLANT
SUT GORETEX NAB #0 THX26 36IN (SUTURE) ×6 IMPLANT
SUT MNCRL AB 4-0 PS2 18 (SUTURE) ×13 IMPLANT
SUT MON AB 2-0 SH 27 (SUTURE) ×6 IMPLANT
SUT NOVA NAB GS-21 0 18 T12 DT (SUTURE) ×1 IMPLANT
SUT VIC AB 0 CT1 27 (SUTURE) ×12
SUT VIC AB 0 CT1 27XBRD ANTBC (SUTURE) ×10 IMPLANT
SUT VIC AB 2-0 SH 27 (SUTURE) ×6
SUT VIC AB 2-0 SH 27XBRD (SUTURE) ×5 IMPLANT
SUT VIC AB 3-0 SH 27 (SUTURE) ×18
SUT VIC AB 3-0 SH 27XBRD (SUTURE) ×10 IMPLANT
SUT VICRYL 2-0 SH 8X27 (SUTURE) IMPLANT
SUT VLOC 180 0 9IN  GS21 (SUTURE) ×6
SUT VLOC 180 0 9IN GS21 (SUTURE) IMPLANT
SUT VLOC 180 2-0 6IN GS21 (SUTURE) IMPLANT
SUT VLOC 180 2-0 9IN GS21 (SUTURE) ×15 IMPLANT
SYR 10ML LL (SYRINGE) ×6 IMPLANT
SYR 20ML LL LF (SYRINGE) ×6 IMPLANT
SYR BULB EAR ULCER 3OZ GRN STR (SYRINGE) ×6 IMPLANT
SYS SLING ADV FIT BLUE TRNSVAG (Sling) ×1 IMPLANT
TOWEL OR 17X26 10 PK STRL BLUE (TOWEL DISPOSABLE) ×12 IMPLANT
TRAY FOLEY W/BAG SLVR 14FR (SET/KITS/TRAYS/PACK) ×6 IMPLANT
TROCAR BLADELESS OPT 5 100 (ENDOMECHANICALS) ×1 IMPLANT

## 2022-01-14 NOTE — Transfer of Care (Signed)
Immediate Anesthesia Transfer of Care Note  [name]  Procedure(s) Performed: Procedure(s) (LRB): XI ROBOTIC ASSISTED TOTAL HYSTERECTOMY WITH BILATERAL SALPINGECTOMY AND   SACROCOLPOPEXY (N/A) TRANSVAGINAL TAPE (TVT) PROCEDURE WITH OPERATIVE ULTRASOUND (N/A) CYSTOSCOPY (N/A) XI ROBOTIC ASSISTED INGUINAL HERNIA (Right) XI ROBOT ASSISTED UMBILICAL HERNIA REPAIR (N/A)  Patient Location: PACU  Anesthesia Type: General  Level of Consciousness: awake, oriented, sedated and patient cooperative  Airway & Oxygen Therapy: Patient Spontanous Breathing and Patient connected to face mask oxygen  Post-op Assessment: Report given to PACU RN and Post -op Vital signs reviewed and stable  Post vital signs: Reviewed and stable  Complications: No apparent anesthesia complications Last Vitals:  Vitals Value Taken Time  BP 125/79 01/14/22 1200  Temp    Pulse 88 01/14/22 1205  Resp 23 01/14/22 1205  SpO2 95 % 01/14/22 1205  Vitals shown include unvalidated device data.  Last Pain:  Vitals:   01/14/22 0609  TempSrc: Oral  PainSc: 0-No pain      Patients Stated Pain Goal: 4 (43/83/81 8403)  Complications: No notable events documented.

## 2022-01-14 NOTE — H&P (Signed)
Admitting Physician: Nickola Major Courtnay Petrilla  Service: General surgery  CC: Umbilical and right inguinal hernia  Subjective   HPI: Evelyn Hayes is an 66 y.o. female who is here for robotic umbilical and right inguinal hernia repair at the time of GYN procedure.  Past Medical History:  Diagnosis Date   Anxiety    Arthritis    right knee   Cancer ([place]) 2004   basal cell on right eyelid   Complication of anesthesia 02/01/2019   Patient experienced a laryngospasm during an EGD. Per endoscopy report by Dr. Carol Ada, " Her oxygen saturation briefly dropped into the mid to high 70's, but she maintained her saturation at around 86%. Jaw thrust was employed and there was a point where positive pressure ventilation was going to be administered, but her laryngospasm resolved. Her oxygen saturation normalized."   COPD (chronic obstructive pulmonary disease) ([place]) 08/13/2020   COPD GOLD stage 1, see 08/20/20 OV w/ Dr. Ottie Glazier, pulmonologist in [place]. Patient is doing great w/ lots of walking and water aerobics as of 01/02/22 per pt.   CPAP (continuous positive airway pressure) dependence 05/2011   DOE (dyspnea on exertion) 02/24/2020   03/14/20 treadmill stress test showed ECG w/o evidence of ischemia or arrhythmia, 04/09/2020 echocardiogram LVEF 55 - 60%, 03/13/2020 Holter monitor rare PACs and occasional PVC, see cardiology OV dated 04/23/20 with Dr. Hassell Done in [place]. Also see 08/20/20 OV with pulmonolgy in Epic.   GERD (gastroesophageal reflux disease)    History of kidney stones 09/2019   Knee cartilage, torn, right 2003   Laryngopharyngeal reflux 12/2005   Obesity    Palpitations 2021   see 04/23/20 cardiology OV note from Dr. Hassell Done in [place].   Pneumonia 2019   Pt states that she was told it was possibly COVID that caused her pneumonia.   Pre-diabetes    5/11 A1c 6.1   Right inguinal hernia 2023   Sleep apnea    borderline, patient has used in about 4 years as of  01/02/22 per pt, she has lost some weight but still snores per pt   Varicose veins of left lower extremity     Past Surgical History:  Procedure Laterality Date   CERVICAL BIOPSY  W/ LOOP ELECTRODE EXCISION  07/07/1994   CIN 3   COLONOSCOPY     x 2, last one 2020?   COLPOSCOPY  07/07/1996   hypo thermal ablation  06/02/2005   KNEE SURGERY  07/07/2004   TONSILLECTOMY AND ADENOIDECTOMY     as a child   WISDOM TOOTH EXTRACTION     as a teenager    Family History  Problem Relation Age of Onset   Hypertension Mother    Heart disease Father    Breast cancer Other    Osteoporosis Maternal Grandmother    Ovarian cancer Cousin 41    Social:  reports that she has quit smoking. Her smoking use included cigarettes. She has never used smokeless tobacco. She reports current alcohol use of about 4.0 standard drinks of alcohol per week. She reports that she does not use drugs.  Allergies: No Known Allergies  Medications: Current Outpatient Medications  Medication Instructions   acetaminophen (TYLENOL) 500 mg, Oral, Every 6 hours PRN   fluticasone (FLONASE) 50 MCG/ACT nasal spray 2 sprays, Each Nare, Daily   ibuprofen (ADVIL) 600 mg, Oral, Every 6 hours PRN   Milk Thistle 1000 MG CAPS No dose, route, or frequency recorded.   naproxen (NAPROSYN)  500 mg, Oral, 2 times daily with meals   omeprazole (PRILOSEC) 20 MG capsule TAKE 1 CAPSULE DAILY   oxyCODONE (OXY IR/ROXICODONE) 5 mg, Oral, Every 4 hours PRN   polyethylene glycol powder (GLYCOLAX/MIRALAX) 17 g, Oral, Daily, Drink 17g (1 scoop) dissolved in water per day.   Red Yeast Rice Extract (RED YEAST RICE PO) Oral   sertraline (ZOLOFT) 100 MG tablet TAKE 1 TABLET BY MOUTH EVERY DAY   Trulicity 6.07 mg, Subcutaneous, Weekly   vitamin C 100 mg, Oral, Daily, Hasn't taken since 6/12 as of 01/02/22   Zinc Sulfate (ZINC 15 PO) Oral, Hasn't taken since 12/16/21 per pt on 01/02/22.    ROS - all of the below systems have been reviewed with the  patient and positives are indicated with bold text General: chills, fever or night sweats Eyes: blurry vision or double vision ENT: epistaxis or sore throat Allergy/Immunology: itchy/watery eyes or nasal congestion Hematologic/Lymphatic: bleeding problems, blood clots or swollen lymph nodes Endocrine: temperature intolerance or unexpected weight changes Breast: new or changing breast lumps or nipple discharge Resp: cough, shortness of breath, or wheezing CV: chest pain or dyspnea on exertion GI: as per HPI GU: dysuria, trouble voiding, or hematuria MSK: joint pain or joint stiffness Neuro: TIA or stroke symptoms Derm: pruritus and skin lesion changes Psych: anxiety and depression  Objective   PE Blood pressure (!) 141/86, pulse 79, temperature 98 F (36.7 C), temperature source Oral, resp. rate 18, height '5\' 2"'$  (1.575 m), weight 99.6 kg, last menstrual period 04/06/2008, SpO2 99 %. Constitutional: NAD; conversant; no deformities Eyes: Moist conjunctiva; no lid lag; anicteric; PERRL Neck: Trachea midline; no thyromegaly Lungs: Normal respiratory effort; no tactile fremitus CV: RRR; no palpable thrills; no pitting edema GI: Abd Right inguinal and umbilical hernias; no palpable hepatosplenomegaly MSK: Normal range of motion of extremities; no clubbing/cyanosis Psychiatric: Appropriate affect; alert and oriented x3 Lymphatic: No palpable cervical or axillary lymphadenopathy  Results for orders placed or performed during the hospital encounter of 01/14/22 (from the past 24 hour(s))  Glucose, capillary     Status: Abnormal   Collection Time: 01/14/22  6:19 AM  Result Value Ref Range   Glucose-Capillary 101 (H) 70 - 99 mg/dL    Imaging Orders  No imaging studies ordered today   CT 09/12/19 reviewed, small umbilical and right inguinal hernias present, fat containing  Assessment and Plan   Evelyn Hayes is an 66 y.o. female with right inguinal and umbilical hernias.  She had  plans for GYN procedure, so we decided to proceed with robotic inguinal and umbilical hernia repair at the time of this procedure.  We discussed my portion of the procedure as well as its risks, benefits and alternatives.  After a full discussion and all questions answered the patient granted consent to proceed.  We will proceed as scheduled.  Felicie Morn, MD  [facility] Use [email or web address] to contact on call provider

## 2022-01-14 NOTE — Op Note (Signed)
[name] (February 17, 1956, [id number])  Date of Surgery: 01/14/2022   Preoperative Diagnosis: uterovaginal prolapse, incomplete; anterior vaginal prolapse; posterior vaginal prolapse; stress urinary incontinence, umbilical and right inguinal hernias   Postoperative Diagnosis: uterovaginal prolapse, incomplete; anterior vaginal prolapse; posterior vaginal prolapse; stress urinary incontinence, umbilical and right inguinal hernias   Surgical Procedure: Panel 1 XI ROBOTIC ASSISTED TOTAL HYSTERECTOMY WITH BILATERAL SALPINGECTOMY AND   SACROCOLPOPEXY:  TRANSVAGINAL TAPE (TVT) PROCEDURE WITH OPERATIVE ULTRASOUND: 09326 (CPT) CYSTOSCOPY: 52000 (CPT) Panel 2 XI ROBOTIC ASSISTED INGUINAL HERNIA: 71245 (CPT) XI ROBOT ASSISTED UMBILICAL HERNIA REPAIR:    Operative Team Members:  Surgeon(s) and Role: Panel 1:    Jaquita Folds, MD - Primary    * Megan Salon, MD - Assisting Panel 2:    * Stephone Gum, Nickola Major, MD - Primary   Anesthesiologist: Santa Lighter, MD CRNA: Justice Rocher, CRNA; Mechele Claude, CRNA; Suan Halter, CRNA; Clearnce Sorrel, CRNA   Anesthesia: General   Fluids:  Total I/O In: 1300 [I.V.:1200; IV Piggyback:100] Out: 375 [Urine:300; YKDXI:33]  Complications: None  Drains:  None  Specimen: None  Disposition:  PACU - hemodynamically stable.  Plan of Care: Discharge to home after PACU  Indications for Procedure:  Evelyn Hayes is an 66 y.o. female with right inguinal and umbilical hernias.  She had plans for GYN procedure, so we decided to proceed with robotic inguinal and umbilical hernia repair at the time of this procedure.  We discussed my portion of the procedure as well as its risks, benefits and alternatives.  After a full discussion and all questions answered the patient granted consent to proceed.  We will proceed as scheduled.    Findings:  Technique: Transabdominal preperitoneal (TAPP) Hernia Location: Indirect right  inguinal hernia Mesh Size &Type:  Bard Soft 3D Max Large Right Sided Mesh Mesh Fixation: 0-Ethibond  Infection status: Patient: Private Patient Elective Case Case: Elective Infection Present At Time Of Surgery (PATOS): None for my portion of the procedure  Description of Procedure:  The patient was positioned supine, padded and secured to the bed.  The abdomen was widely prepped and draped.  A time out procedure was performed.    A curvilinear incision was made above the umbilicus and dissection was carried down through the subcutaneous tissue to the level of the fascia.  The umbilical stalk was encircled and the hernia sac amputated off the umbilical skin.  The hernia defect was measured as 0.5 cm wide by 0.5 cm in vertical dimension.    The umbilical hernia was used to place a 42m robotic trocar and the abdomen was insufflated to 15 mmHg.  Dr. SWindy Cannythen performed her portion of the operation - please see her notes for more detail.  I returned later in the case to perform the hernia repair.  There was an INDIRECT fat containing hernia on the right.  Utilizing a robotic transabdominal pre peritoneal technique (TAPP), a horizontal incision was made in the peritoneum, immediately below the umbilicus.  Dissection was carried out in the pre peritoneal space down to the level of the hernia sac which was reduced into the peritoneal cavity completely.  The round ligament was identified and divided utilizing cautery.  A large pre peritoneal dissection was performed to uncover the direct, indirect, femoral and obturator spaces.  Cooper's ligament was uncovered medially and the psoas muscle uncovered laterally.  The mesh, as documented above, was opened and advanced into the pre peritoneal position  so that it more than adequately covered the indirect, direct, femoral and obturator spaces.  The mesh laid flat, with no inferior folds and covered the entire myopectineal orifice.  The mesh was fixated with  an 0-ethibond suture to Cooper's ligament.  The peritoneal flap was closed with a 2-0 v-loc.  There were no peritoneal defects or exposed mesh at the conclusion.  The umbilical trocar was removed and the fascial defect was closed with a figure of eight 0 novafil suture, utilizing a suture passer.  The peritoneal cavity was completely desufflated, the trocars removed.  The umbilical skin was tacked down to the fascial repair with 3-0 vicryl suture.  The skin closed with 4-0 Monocryl subcuticular suture and skin glue.  All sponge and needle counts were correct at the end of the case.  Louanna Raw, MD General, Bariatric, & Minimally Invasive Surgery [facility], [place]

## 2022-01-14 NOTE — Anesthesia Postprocedure Evaluation (Signed)
Anesthesia Post Note  [name]  Procedure(s) Performed: XI ROBOTIC ASSISTED TOTAL HYSTERECTOMY WITH BILATERAL SALPINGECTOMY AND   SACROCOLPOPEXY (Pelvis) TRANSVAGINAL TAPE (TVT) PROCEDURE WITH OPERATIVE ULTRASOUND (Vagina ) CYSTOSCOPY (Bladder) XI ROBOTIC ASSISTED INGUINAL HERNIA (Right: Pelvis) XI ROBOT ASSISTED UMBILICAL HERNIA REPAIR (Abdomen)     Patient location during evaluation: PACU Anesthesia Type: General Level of consciousness: awake and alert Pain management: pain level controlled Vital Signs Assessment: post-procedure vital signs reviewed and stable Respiratory status: spontaneous breathing, nonlabored ventilation, respiratory function stable and patient connected to nasal cannula oxygen Cardiovascular status: blood pressure returned to baseline and stable Postop Assessment: no apparent nausea or vomiting Anesthetic complications: no   No notable events documented.  Last Vitals:  Vitals:   01/14/22 1230 01/14/22 1245  BP: 117/73 115/72  Pulse: 83 86  Resp: 11 17  Temp: (!) 36.3 C   SpO2: 93% 93%    Last Pain:  Vitals:   01/14/22 1245  TempSrc:   PainSc: [place] Evelyn Hayes

## 2022-01-14 NOTE — Anesthesia Procedure Notes (Signed)
Procedure Name: Intubation Date/Time: 01/14/2022 7:35 AM  Performed by: Suan Halter, CRNAPre-anesthesia Checklist: Patient identified, Emergency Drugs available, Suction available and Patient being monitored Patient Re-evaluated:Patient Re-evaluated prior to induction Oxygen Delivery Method: Circle system utilized Preoxygenation: Pre-oxygenation with 100% oxygen Induction Type: IV induction Ventilation: Mask ventilation without difficulty Laryngoscope Size: Mac and 3 Grade View: Grade I Tube type: Oral Tube size: 7.0 mm Number of attempts: 1 Airway Equipment and Method: Stylet and Oral airway Placement Confirmation: ETT inserted through vocal cords under direct vision, positive ETCO2 and breath sounds checked- equal and bilateral Secured at: 22 cm Tube secured with: Tape Dental Injury: Teeth and Oropharynx as per pre-operative assessment

## 2022-01-14 NOTE — Interval H&P Note (Signed)
History and Physical Interval Note:  01/14/2022 7:02 AM  Evelyn Hayes  has presented today for surgery, with the diagnosis of uterovaginal prolapse, incomplete; anterior vaginal prolapse; posterior vaginal prolapse; stress urinary incontinence.  The various methods of treatment have been discussed with the patient and family. After consideration of risks, benefits and other options for treatment, the patient has consented to  Procedure(s): XI ROBOTIC ASSISTED TOTAL HYSTERECTOMY WITH BILATERAL SALPINGECTOMY AND   SACROCOLPOPEXY (N/A) TRANSVAGINAL TAPE (TVT) PROCEDURE (N/A) CYSTOSCOPY (N/A) XI ROBOTIC ASSISTED INGUINAL HERNIA (Right) XI ROBOT ASSISTED UMBILICAL HERNIA REPAIR (N/A) as a surgical intervention.  The patient's history has been reviewed, patient examined, no change in status, stable for surgery.  I have reviewed the patient's chart and labs.  Questions were answered to the patient's satisfaction.     Jaquita Folds

## 2022-01-14 NOTE — Discharge Instructions (Addendum)
 GROIN HERNIA REPAIR POST OPERATIVE INSTRUCTIONS  Thinking Clearly  The anesthesia may cause you to feel different for 1 or 2 days. Do not drive, drink alcohol, or make any big decisions for at least 2 days.  Nutrition When you wake up, you will be able to drink small amounts of liquid. If you do not feel sick, you can slowly advance your diet to regular foods. Continue to drink lots of fluids, usually about 8 to 10 glasses per day. Eat a high-fiber diet so you don't strain during bowel movements. High-Fiber Foods Foods high in fiber include beans, bran cereals and whole-grain breads, peas, dried fruit (figs, apricots, and dates), raspberries, blackberries, strawberries, sweet corn, broccoli, baked potatoes with skin, plums, pears, apples, greens, and nuts. Activity Slowly increase your activity. Be sure to get up and walk every hour or so to prevent blood clots. No heavy lifting or strenuous activity for 4 weeks following surgery to prevent hernias at your incision sites or recurrence of your hernia. It is normal to feel tired. You may need more sleep than usual.  Get your rest but make sure to get up and move around frequently to prevent blood clots and pneumonia.  Work and Return to School You can go back to work when you feel well enough. Discuss the timing with your surgeon. You can usually go back to school or work 1 week or less after an laparoscopic or an open repair. If your work requires heavy lifting or strenuous activity you need to be placed on light duty for 4 weeks following surgery. You can return to gym class, sports or other physical activities 4 weeks after surgery.  Wound Care You may experience significant bruising in the groin including into the scrotum in males.  Rest, elevating the groin and scrotum above the level of the heart, ice and compression with tight fitting underwear can help.  Always wash your hands before and after touching near your incision site. Do  not soak in a bathtub until cleared at your follow up appointment. You may take a shower 24 hours after surgery. A small amount of drainage from the incision is normal. If the drainage is thick and yellow or the site is red, you may have an infection, so call your surgeon. If you have a drain in one of your incisions, it will be taken out in office when the drainage stops. Steri-Strips will fall off in 7 to 10 days or they will be removed during your first office visit. If you have dermabond glue covering over the incision, allow the glue to flake off on its own. Protect the new skin, especially from the sun. The sun can burn and cause darker scarring. Your scar will heal in about 4 to 6 weeks and will become softer and continue to fade over the next year.  The cosmetic appearance of the incisions will improve over the course of the first year after surgery. Sensation around your incision will return in a few weeks or months.  Bowel Movements After intestinal surgery, you may have loose watery stools for several days. If watery diarrhea lasts longer than 3 days, contact your surgeon. Pain medication (narcotics) can cause constipation. Increase the fiber in your diet with high-fiber foods if you are constipated. You can take an over the counter stool softener like Colace to avoid constipation.  Additional over the counter medications can also be used if Colace isn't sufficient (for example, Milk of Magnesia or Miralax).    Pain The amount of pain is different for each person. Some people need only 1 to 3 doses of pain control medication, while others need more. Take alternating doses of tylenol and ibuprofen around the clock for the first five days following surgery.  This will provide a baseline of pain control and help with inflammation.  Take the narcotic pain medication in addition if needed for severe pain.  Contact Your Surgeon at [phone], if you have: Pain that will not go away Pain that  gets worse A fever of more than 101F (38.3C) Repeated vomiting Swelling, redness, bleeding, or bad-smelling drainage from your wound site Strong abdominal pain No bowel movement or unable to pass gas for 3 days Watery diarrhea lasting longer than 3 days  Pain Control The goal of pain control is to minimize pain, keep you moving and help you heal. Your surgical team will work with you on your pain plan. Most often a combination of therapies and medications are used to control your pain. You may also be given medication (local anesthetic) at the surgical site. This may help control your pain for several days. Extreme pain puts extra stress on your body at a time when your body needs to focus on healing. Do not wait until your pain has reached a level "10" or is unbearable before telling your doctor or nurse. It is much easier to control pain before it becomes severe. Following a laparoscopic procedure, pain is sometimes felt in the shoulder. This is due to the gas inserted into your abdomen during the procedure. Moving and walking helps to decrease the gas and the right shoulder pain.  Use the guide below for ways to manage your post-operative pain. Learn more by going to facs.org/safepaincontrol.  How Intense Is My Pain Common Therapies to Feel Better       I hardly notice my pain, and it does not interfere with my activities.  I notice my pain and it distracts me, but I can still do activities (sitting up, walking, standing).  Non-Medication Therapies  Ice (in a bag, applied over clothing at the surgical site), elevation, rest, meditation, massage, distraction (music, TV, play) walking and mild exercise Splinting the abdomen with pillows +  Non-Opioid Medications Acetaminophen (Tylenol) Non-steroidal anti-inflammatory drugs (NSAIDS) Aspirin, Ibuprofen (Motrin, Advil) Naproxen (Aleve) Take these as needed, when you feel pain. Both acetaminophen and NSAIDs help to decrease pain  and swelling (inflammation).      My pain is hard to ignore and is more noticeable even when I rest.  My pain interferes with my usual activities.  Non-Medication Therapies  +  Non-Opioid medications  Take on a regular schedule (around-the-clock) instead of as needed. (For example, Tylenol every 6 hours at 9:00 am, 3:00 pm, 9:00 pm, 3:00 am and Motrin every 6 hours at 12:00 am, 6:00 am, 12:00 pm, 6:00 pm)         I am focused on my pain, and I am not doing my daily activities.  I am groaning in pain, and I cannot sleep. I am unable to do anything.  My pain is as bad as it could be, and nothing else matters.  Non-Medication Therapies  +  Around-the-Clock Non-Opioid Medications  +  Short-acting opioids  Opioids should be used with other medications to manage severe pain. Opioids block pain and give a feeling of euphoria (feel high). Addiction, a serious side effect of opioids, is rare with short-term (a few days) use.  Examples of short-acting opioids   include: Tramadol (Ultram), Hydrocodone (Norco, Vicodin), Hydromorphone (Dilaudid), Oxycodone (Oxycontin)     The above directions have been adapted from the [employer] Surgical Patient Education Program.  Please refer to the ACS website if needed: PreferredVet.ca.ashx   Louanna Raw, MD [facility], [address], [place], [place], [place]  [address] ?  [address], [place], [place]   [address] [phone] ? [phone] ? FAX [phone] Web site: [email or web address]   POST OPERATIVE INSTRUCTIONS  General Instructions Recovery (not bed rest) will last approximately 6 weeks Walking is encouraged, but refrain from strenuous exercise/ housework/ heavy lifting. No lifting >10lbs  Nothing in the vagina- NO intercourse, tampons or douching Bathing:  Do not submerge in water (NO swimming, bath,  hot tub, etc) until after your postop visit. You can shower starting the day after surgery.  No driving until you are not taking narcotic pain medicine and until your pain is well enough controlled that you can slam on the breaks or make sudden movements if needed.   Taking your medications Please take your acetaminophen and ibuprofen on a schedule for the first 48 hours. Take '600mg'$  ibuprofen, then take '500mg'$  acetaminophen 3 hours later, then continue to alternate ibuprofen and acetaminophen. That way you are taking each type of medication every 6 hours. Take the prescribed narcotic (oxycodone, tramadol, etc) as needed, with a maximum being every 4 hours.  Take a stool softener daily to keep your stools soft and preventing you from straining. If you have diarrhea, you decrease your stool softener. This is explained more below. We have prescribed you Miralax.  Reasons to Call the Nurse (see last page for phone numbers) Heavy Bleeding (changing your pad every 1-2 hours) Persistent nausea/vomiting Fever (100.4 degrees or more) Incision problems (pus or other fluid coming out, redness, warmth, increased pain)  Things to Expect After Surgery Mild to Moderate pain is normal during the first day or two after surgery. If prescribed, take Ibuprofen or Tylenol first and use the stronger medicine for "break-through" pain. You can overlap these medicines because they work differently.   Constipation   To Prevent Constipation:  Eat a well-balanced diet including protein, grains, fresh fruit and vegetables.  Drink plenty of fluids. Walk regularly.  Depending on specific instructions from your physician: take Miralax daily and additionally you can add a stool softener (colace/ docusate) and fiber supplement. Continue as long as you're on pain medications.   To Treat Constipation:  If you do not have a bowel movement in 2 days after surgery, you can take 2 Tbs of Milk of Magnesia 1-2 times a day until you have  a bowel movement. If diarrhea occurs, decrease the amount or stop the laxative. If no results with Milk of Magnesia, you can drink a bottle of magnesium citrate which you can purchase over the counter.  Fatigue:  This is a normal response to surgery and will improve with time.  Plan frequent rest periods throughout the day.  Gas Pain:  This is very common but can also be very painful! Drink warm liquids such as herbal teas, bouillon or soup. Walking will help you pass more gas.  Mylicon or Gas-X can be taken over the counter.  Leaking Urine:  Varying amounts of leakage may occur after surgery.  This should improve with time. Your bladder needs at least 3 months to recover from surgery. If you leak after surgery, be sure to mention this to your doctor at your  post-op visit. If you were taking medications for overactive bladder prior to surgery, be sure to restart the medications immediately after surgery.  Incisions: If you have incisions on your abdomen, the skin glue will dissolve on its own over time. It is ok to gently rinse with soap and water over these incisions but do not scrub.  Catheter Approximately 50% of patients are unable to urinate after surgery and need to go home with a catheter. This allows your bladder to rest so it can return to full function. If you go home with a catheter, the office will call to set up a voiding trial a few days after surgery. For most patients, by this visit, they are able to urinate on their own. Long term catheter use is rare.   Return to Work  As work demands and recovery times vary widely, it is hard to predict when you will want to return to work. If you have a desk job with no strenuous physical activity, and if you would like to return sooner than generally recommended, discuss this with your provider or call our office.   Post op concerns  For non-emergent issues, please call the Urogynecology Nurse. Please leave a message and someone will contact  you within one business day.  You can also send a message through [employer].   AFTER HOURS (After 5:00 PM and on weekends):  For urgent matters that cannot wait until the next business day. Call our office [phone] and connect to the doctor on call.  Please reserve this for important issues.   **FOR ANY TRUE EMERGENCY ISSUES CALL 911 OR GO TO THE NEAREST EMERGENCY ROOM.** Please inform our office or the doctor on call of any emergency.     APPOINTMENTS: Call [phone]

## 2022-01-14 NOTE — Op Note (Addendum)
Operative Note  Preoperative Diagnosis: anterior vaginal prolapse, posterior vaginal prolapse, uterovaginal prolapse, incomplete, and stress urinary incontinence  Postoperative Diagnosis: same  Procedures performed:  Robotic assisted total laparoscopic hysterectomy, bilateral salpingectomy, sacrocolpopexy Erenest Blank Lite Y mesh), midurethral sling (Advantage Fit), cystoscopy, intraoperative transvaginal ultrasound  Robotic right Inguinal and umbilical hernia repairs performed by Dr Thermon Leyland  Implants:  Implant Name Type Inv. Item Serial No. Manufacturer Lot No. LRB No. Used Action  MESH Valli Glance [id number] Mesh General Granite [id number] N/A 1 Implanted  SYS SLING ADV FIT BLUE TRNSVAG - [id number] Sling SYS SLING ADV FIT BLUE TRNSVAG  [employer] [id number] N/A 1 Implanted  MESH 3DMAX 4X6 RT LRG - [id number] Mesh General MESH 3DMAX 4X6 RT LRG  DAVOL INC BARD ACCESS [id number] N/A 1 Implanted    Attending Surgeon: Sherlene Shams, MD  Assistant Surgeon: Lyman Speller, MD   Anesthesia: General endotracheal  Findings: 1. On vaginal exam, stage II Pelvic organ prolapse noted  2. Small fullness in left suburethra- no cyst noted on ultrasound  3. Normal appearing uterus, fallopian tubes and ovaries on laparoscopy  4. On cystoscopy, normal bladder and urethra without injury, lesion or foreign body. Brisk bilateral ureteral efflux noted.    Specimens:  ID Type Source Tests Collected by Time Destination  1 : uterus, cervix, and bilateral fallopian tubes Tissue PATH Gyn benign resection SURGICAL PATHOLOGY Jaquita Folds, MD 01/14/2022 [phone]     Estimated blood loss: 75 mL  IV fluids: 1200 mL  Urine output: 540 mL  Complications: none  Procedure in Detail:  After informed consent was obtained, the patient was taken to the operating room, where general anesthesia was induced and found to be adequate. She was placed in  dorsolithotomy position in yellowfin stirrups. Her hips were noted not to be hyperflexed or hyperextended. Her arms were padded with gel pads and tucked to her sides. Her hands were surrounded by foam. A padded strap was placed across her chest with foam between the pad and her skin. She was noted to be appropriately positioned with all pressure points well padded and off tension. A tilt test showed no slippage. She was prepped and draped in the usual sterile fashion. A uterine manipulator was placed in the uterus after sounding to 6 cm, an appropriately sized Koh ring was placed around the cervix, and a pneumo-occluder balloon was positioned in the vagina for later use.  A sterile Foley catheter was inserted.   0.25% plain Marcaine was injected in the umbilical area and was incised by a scalpel. Dissection down to fascia was performed by Dr Thermon Leyland and the peritoneum was entered. A 12 mm trocar was placed with a 36m insert.  The robotic camera was inserted and intraperitoneal placement was confirmed. Survey of the abdomen and pelvis was performed. The sacrum appeared to be free of any adhesive disease. After determining placement for the other ports, Local anesthetic was injected at each site and two 8 mm incisions were made for robotic ports at 10 cm lateral to and at the level of the umbilical port. Two additional 8 mm incisions were made 10 cm lateral to these and 30 degrees down followed by 8 mm robotic ports - the right side for an assistant port. All trocars were placed sequentially under direct visualization of the camera. The patient was placed in Trendelenburg. The robot was docked on the patient's right side. A vessel sealer alternating with monopolar  scissors were placed in the right arm, a [place] bipolar grasper was placed in the 2nd arm of the patient's left side, and a Tip up grasper was placed in the 3rd arm on the patient's left side.   Attention was then turned to the robotic  hysterectomy and salpingecomy. The ureter was identified and was found to be well away from the planned site of incision. The right fallopian tube was grasped and the mesosalpinx was cauaterized and incised. The uteroovarian ligament was cauterized and cut. The right round ligament was grasped, cauterized, and transected with electrocautery. The anterior and posterior leaves of the broad ligament were taken down with cautery and sharp dissection. The uterine artery was skeletonized and the bladder flap was created on the right side with a combination of electrosurgery and sharp dissection. The KOH ring was identified. The right uterine artery was clamped, cauterized, and transected. In a similar fashion, the left side was taken down. Further sharp dissection with combination of cautery was performed to further develop the bladder flap. At this point, the KOH ring was completely hugging the cervix. The pneumo-occluder balloon in the vagina was inflated to maintain pneumoperitoneum. A colpotomy was performed with electrosurgical cutting current and the uterus and cervix were completely amputated from the vagina. The specimen was delivered through the vagina. The posterior portion of the vaginal cuff was then grasped and pulled up to maintain pneumoperitoneum. The pneumo-occluder balloon was then replaced in the vagina. The right hand instrument was changed to a suture-cut needle driver. The vaginal cuff was then closed using a 0 V-lock suture.    The right hand instrument was replaced with monopolar endoshears. With a lucite probe in the vagina, the anterior vaginal dissection was then performed with sharp dissection and electrosurgery. The posterior vaginal dissection was then performed with sharp dissection and electrosurgery in order to dissect the rectum away from the posterior vagina. Attention was then turned to the sacral promontory. The peritoneum overlying the sacral promontory was tented up, dissected  sharply with monopolar scissors and electrosurgery using layer by layer technique. The peritoneal incision was extended down to the posterior cul-de-sac. This was performed with care to avoid the ureter on the right side and the sigmoid colon and its mesentary on the left side.  A "Y" mesh was then inserted into the abdomen after trimming to appropriate size. With the probe in the vagina, the anterior leaf of the Y mesh was affixed to the anterior portion of the vagina using a 2-0 v-loc suture in a spiral pattern to distribute the suture evenly across the surface of the anterior mesh leaf. In a similar fashion, the posterior leaf of the Y mesh was attached to the posterior surface of the vagina with 2-0 v-loc suture. During this portion of  the procedure, an EA sizer was placed in the rectum to delineate the rectum from the vagina.  The distal end of the mesh was then brought to overlie the sacrum. The correct amount of tension was determined in order to elevate the vagina, but not put the mesh under tension. The distal end of the mesh was then affixed to the anterior longitudinal sacral ligament using two interrupted transverse stitches of CV2 Gortex. The excess distal mesh was then cut and removed. The peritoneum was reapproximated over the mesh using 2-0 monocryl. The bladder flap was incorporated to completely retroperitonealize the mesh. All pedicles were carefully inspected and noted to be hemostatic as the CO2 gas was deflated. All instruments  were removed from the patient's abdomen.   The Foley catheter was removed.  A 70-degree cystoscope was introduced, and 360-degree inspection revealed no injury, lesion or foreign body in the bladder. Brisk bilateral ureteral efflux was noted with the assistance of pyridium.  The bladder was drained and the cystoscope was removed.  The Foley catheter was replaced.  Dr Thermon Leyland performed his part of the procedure. Please see his operative note for details.   The  robot was undocked. The CO2 gas was removed and the ports were removed.  The skin incisions were closed with subcutaneous stitches of 4-0 Monocryl and covered with skin glue.    Attention was turned to the sling. A lonestar self-retraining retractor was placed with 4 stay hooks. The mid urethral area was located on the anterior vaginal wall. A fullness was noted on the left side of the suburethral area and a questionable cyst was present. Intraoperative pelvic ultrasound was performed and no cystic structure noted. Decision was made to proceed with the sling.  Two Allis clamps were placed at the level of the midurethra. 1% lidocaine with epinephrine was injected into the vaginal mucosa. A vertical incision was made between the two clamps using a 15-blade scalpel.  Using sharp dissection, Metzenbaum scissors were used to make a periurethral tunnel from the vaginal incision towards the pubic rami bilaterally for the future sling tracts. The bladder was ensured to be empty. The trocar and attached sling were introduced into the right side of the periurethral vaginal incision, just inferior to the pubic symphysis on the right side. The trocar was guided through the endopelvic fascia and directly vertically.  While hugging the cephalad surface of the pubic bone, the trocar was guided out through the abdomen 2 fingerbreadths lateral to midline at the level of the pubic symphysis on the ipsilateral side. The trocar was placed on the left side in a similar fashion.  A 70-degree cystoscope was introduced, and 360-degree inspection revealed no trauma or trocars in the bladder, with brisk bilateral ureteral efflux.  The bladder was drained and the cystoscope was removed.  The Foley catheter was reinserted.  The sling was brought to lie beneath the mid-urethra.  A needle driver was placed behind the sling to ensure no tension.   The plastic sheath was removed from the sling and the distal ends of the sling were trimmed just  below the level of the skin incisions.  Tension-free positioning of the sling was confirmed. Vaginal inspection revealed no vaginotomy or sling perforations of the mucosa.  The vaginal mucosal edges were reapproximated using 2-0 Vicryl.  The vagina was copiously irrigated.  Hemostasis was again noted. Vaginal packing not placed. The suprapubic sling incisions were closed with Dermabond. The patient tolerated the procedure well.  She was awakened from anesthesia and transferred to the recovery room in stable condition. All needle and sponge counts were correct x 2.  Dr. Ammie Ferrier assistance was required throughout the case due to its complexity.  Jaquita Folds, MD

## 2022-01-15 ENCOUNTER — Encounter (HOSPITAL_BASED_OUTPATIENT_CLINIC_OR_DEPARTMENT_OTHER): Payer: Self-pay | Admitting: Obstetrics and Gynecology

## 2022-01-15 DIAGNOSIS — J449 Chronic obstructive pulmonary disease, unspecified: Secondary | ICD-10-CM | POA: Diagnosis not present

## 2022-01-15 DIAGNOSIS — Z7984 Long term (current) use of oral hypoglycemic drugs: Secondary | ICD-10-CM | POA: Diagnosis not present

## 2022-01-15 DIAGNOSIS — N393 Stress incontinence (female) (male): Secondary | ICD-10-CM | POA: Diagnosis not present

## 2022-01-15 DIAGNOSIS — G4730 Sleep apnea, unspecified: Secondary | ICD-10-CM | POA: Diagnosis not present

## 2022-01-15 DIAGNOSIS — Z87891 Personal history of nicotine dependence: Secondary | ICD-10-CM | POA: Diagnosis not present

## 2022-01-15 DIAGNOSIS — E119 Type 2 diabetes mellitus without complications: Secondary | ICD-10-CM | POA: Diagnosis not present

## 2022-01-15 DIAGNOSIS — F419 Anxiety disorder, unspecified: Secondary | ICD-10-CM | POA: Diagnosis not present

## 2022-01-15 DIAGNOSIS — M1990 Unspecified osteoarthritis, unspecified site: Secondary | ICD-10-CM | POA: Diagnosis not present

## 2022-01-15 DIAGNOSIS — Z6841 Body Mass Index (BMI) 40.0 and over, adult: Secondary | ICD-10-CM | POA: Diagnosis not present

## 2022-01-15 DIAGNOSIS — N812 Incomplete uterovaginal prolapse: Secondary | ICD-10-CM | POA: Diagnosis not present

## 2022-01-15 DIAGNOSIS — K219 Gastro-esophageal reflux disease without esophagitis: Secondary | ICD-10-CM | POA: Diagnosis not present

## 2022-01-15 LAB — SURGICAL PATHOLOGY

## 2022-01-15 MED ORDER — OXYCODONE HCL 5 MG PO TABS
ORAL_TABLET | ORAL | Status: AC
  Filled 2022-01-15: qty 2

## 2022-01-15 MED ORDER — ACETAMINOPHEN 325 MG PO TABS
ORAL_TABLET | ORAL | Status: AC
  Filled 2022-01-15: qty 2

## 2022-01-16 ENCOUNTER — Encounter: Payer: Self-pay | Admitting: *Deleted

## 2022-01-16 NOTE — Discharge Summary (Signed)
Physician Discharge Summary   Patient ID: Evelyn Hayes [id number] 66 y.o. Dec 26, 1955  Admit date: 01/14/2022  Discharge date and time: 01/15/2022  9:04 AM   Admitting Physician: Jaquita Folds, MD   Admission Diagnoses: Uterovaginal prolapse, incomplete [N81.2]  Discharge Diagnoses: same  Admission Condition: good  Discharged Condition: good  Hospital Course: Patient underwent Robotic assisted total laparoscopic hysterectomy, bilateral salpingectomy, sacrocolpopexy Erenest Blank Lite Y mesh), midurethral sling (Advantage Fit), cystoscopy, intraoperative transvaginal ultrasound. Post-operatively, her pain was controlled with oral medication, she was tolerating regular diet and was able to ambulate well. She also passed her voiding trial.   Consults: none  Significant Diagnostic Studies: none  Treatments: surgery  Discharge Exam: BP (!) 105/59 (BP Location: Left Arm)   Pulse 75   Temp 98.3 F (36.8 C)   Resp 18   Ht '5\' 2"'$  (1.575 m)   Wt 99.6 kg   LMP 04/06/2008   SpO2 95%   BMI 40.17 kg/m   General Appearance:    Alert, cooperative, no distress, appears stated age  Lungs:     Clear to auscultation bilaterally, respirations unlabored   Heart:    Regular rate and rhythm, S1 and S2 normal, no murmur, rub   or gallop  Abdomen:     Soft, non-tender, bowel sounds active all four quadrants,    no masses, no organomegaly. Incisions C/D/I  Extremities:   Extremities normal, atraumatic, no cyanosis or edema    Disposition: Discharge disposition: 01-Home or Self Care       Patient Instructions:  Allergies as of 01/15/2022   No Known Allergies      Medication List     TAKE these medications    acetaminophen 500 MG tablet Commonly known as: TYLENOL Take 1 tablet (500 mg total) by mouth every 6 (six) hours as needed (pain). Notes to patient: Can take another dose at 12:30pm   fluticasone 50 MCG/ACT nasal spray Commonly known as: FLONASE Place 2 sprays  into both nostrils daily.   ibuprofen 600 MG tablet Commonly known as: ADVIL Take 1 tablet (600 mg total) by mouth every 6 (six) hours as needed.   Milk Thistle 1000 MG Caps   naproxen 500 MG tablet Commonly known as: Naprosyn Take 1 tablet (500 mg total) by mouth 2 (two) times daily with a meal. What changed:  when to take this reasons to take this   omeprazole 20 MG capsule Commonly known as: PRILOSEC TAKE 1 CAPSULE DAILY   oxyCODONE 5 MG immediate release tablet Commonly known as: Oxy IR/ROXICODONE Take 1 tablet (5 mg total) by mouth every 4 (four) hours as needed for severe pain. Notes to patient: Can take a dose at 12pm   polyethylene glycol powder 17 GM/SCOOP powder Commonly known as: GLYCOLAX/MIRALAX Take 17 g by mouth daily. Drink 17g (1 scoop) dissolved in water per day.   RED YEAST RICE PO Take by mouth.   sertraline 100 MG tablet Commonly known as: ZOLOFT TAKE 1 TABLET BY MOUTH EVERY DAY   Trulicity 9.83 JA/2.5KN Sopn Generic drug: Dulaglutide Inject 0.75 mg into the skin once a week.   vitamin C 100 MG tablet Take 100 mg by mouth daily. Hasn't taken since 6/12 as of 01/02/22   ZINC 15 PO Take by mouth. Hasn't taken since 12/16/21 per pt on 01/02/22.         Signed: Jaquita Folds, MD

## 2022-01-17 ENCOUNTER — Encounter: Payer: Self-pay | Admitting: Obstetrics and Gynecology

## 2022-02-12 DIAGNOSIS — M1711 Unilateral primary osteoarthritis, right knee: Secondary | ICD-10-CM | POA: Diagnosis not present

## 2022-02-25 ENCOUNTER — Ambulatory Visit (INDEPENDENT_AMBULATORY_CARE_PROVIDER_SITE_OTHER): Payer: BC Managed Care – PPO | Admitting: Obstetrics and Gynecology

## 2022-02-25 ENCOUNTER — Encounter: Payer: Self-pay | Admitting: Obstetrics and Gynecology

## 2022-02-25 VITALS — BP 118/73 | HR 101

## 2022-02-25 DIAGNOSIS — Z9889 Other specified postprocedural states: Secondary | ICD-10-CM

## 2022-02-25 NOTE — Progress Notes (Signed)
[facility]  Date of Visit: 02/25/2022  History of Present Illness: Ms. Marquard is a 66 y.o. female scheduled today for a post-operative visit.   Surgery: s/p Robotic assisted total laparoscopic hysterectomy, bilateral salpingectomy, sacrocolpopexy Erenest Blank Lite Y mesh), midurethral sling (Advantage Fit), cystoscopy, intraoperative transvaginal ultrasound on 01/14/22. She had a concurrent umbilical and inguinal hernia repair with Dr Thermon Leyland.  She passed her postoperative void trial.   Postoperative course has been uncomplicated.   Today she reports she is feeling well. Abdominal discomfort has gradually been improving  UTI in the last 6 weeks? No  Pain? No  Vaginal bulge? No Stress incontinence: No  Urgency/frequency: No  Urge incontinence: No  Voiding dysfunction: No  Bowel issues: No - has been taking stool softener  Subjective Success: Do you usually have a bulge or something falling out that you can see or feel in the vaginal area? No  Retreatment Success: Any retreatment with surgery or pessary for any compartment? No   Pathology results: UTERUS WITH RIGHT AND LEFT FALLOPIAN TUBE, HYSTERECTOMY AND BILATERAL  SALPINGECTOMY:  Benign inactive endometrium with cystic change  Minimal chronic cervicitis with squamous metaplasia  Ectocervix showing hyperparakeratosis  Benign fallopian tubes  Negative for malignancy   Medications: She has a current medication list which includes the following prescription(s): acetaminophen, vitamin c, trulicity, fluticasone, ibuprofen, milk thistle, naproxen, omeprazole, polyethylene glycol powder, red yeast rice extract, sertraline, and zinc sulfate.   Allergies: Patient has No Known Allergies.   Physical Exam: BP 118/73   Pulse (!) 101   LMP 04/06/2008   Abdomen: soft, non-tender, without masses or organomegaly Laparoscopic Incisions: healing well.  Pelvic Examination: Vagina: Incisions healing well. Normal mucosa. Cuff  well healed, no sutures present. No tenderness along the anterior or posterior vagina. No apical tenderness. No pelvic masses. No visible or palpable mesh.  POP-Q: POP-Q  -3                                            Aa   -3                                           Ba  -9                                              C   5                                            Gh  5                                            Pb  9                                            tvl  0                                            Ap  0                                            Bp                                                 D     ---------------------------------------------------------  Assessment and Plan:  1. Post-operative state     - Pathology results were reviewed with the patient today and she verbalized understanding that the results were benign.  - We discussed that she has some residual posterior vaginal wall prolapse, although she is asymptomatic. Therefore no need for treatment unless it is bothersome to her. We discussed avoiding straining with bowel movements to prevent it from worsening.  - Can resume regular activity including exercise and intercourse,  if desired.  - Discussed avoidance of heavy lifting and straining long term to reduce the risk of recurrence.  - Advised to follow up with Dr Thermon Leyland as well to ensure she is following any post operative restrictions.   Follow up as needed  Jaquita Folds, MD

## 2022-03-18 ENCOUNTER — Encounter: Payer: Self-pay | Admitting: Family Medicine

## 2022-03-18 ENCOUNTER — Ambulatory Visit (INDEPENDENT_AMBULATORY_CARE_PROVIDER_SITE_OTHER): Payer: BC Managed Care – PPO | Admitting: Family Medicine

## 2022-03-18 VITALS — BP 109/73 | HR 87 | Resp 16 | Ht 63.0 in | Wt 221.0 lb

## 2022-03-18 DIAGNOSIS — E119 Type 2 diabetes mellitus without complications: Secondary | ICD-10-CM

## 2022-03-18 DIAGNOSIS — R7303 Prediabetes: Secondary | ICD-10-CM | POA: Diagnosis not present

## 2022-03-18 DIAGNOSIS — M1711 Unilateral primary osteoarthritis, right knee: Secondary | ICD-10-CM | POA: Diagnosis not present

## 2022-03-18 DIAGNOSIS — E6609 Other obesity due to excess calories: Secondary | ICD-10-CM

## 2022-03-18 DIAGNOSIS — Z6839 Body mass index (BMI) 39.0-39.9, adult: Secondary | ICD-10-CM

## 2022-03-18 DIAGNOSIS — I83812 Varicose veins of left lower extremities with pain: Secondary | ICD-10-CM | POA: Diagnosis not present

## 2022-03-18 DIAGNOSIS — E7800 Pure hypercholesterolemia, unspecified: Secondary | ICD-10-CM

## 2022-03-18 DIAGNOSIS — N812 Incomplete uterovaginal prolapse: Secondary | ICD-10-CM

## 2022-03-18 LAB — POCT GLYCOSYLATED HEMOGLOBIN (HGB A1C)
Est. average glucose Bld gHb Est-mCnc: 111
Hemoglobin A1C: 5.5 % (ref 4.0–5.6)

## 2022-03-18 MED ORDER — TRULICITY 1.5 MG/0.5ML ~~LOC~~ SOAJ: 1.5000 mg | SUBCUTANEOUS | 3 refills | Status: AC

## 2022-03-18 NOTE — Progress Notes (Signed)
Established patient visit   [name]   DOB: Nov 27, 1955   66 y.o. Female  MRN: [id number] Visit Date: 03/18/2022  Today's healthcare provider: Wilhemena Durie, MD   Chief Complaint  Patient presents with   Follow-up   Subjective    HPI  Patient feels very well.  Trulicity has helped her a good bit with her sugar control and weight loss. He is scheduled at the end of November for a right total knee replacement with Dr. Harlow Mares. She is having no symptoms suggesting that she should not have the surgery.  Negative for cardiac or neurologic symptoms at all.  Follow up for Class 2 obesity due to excess calories without serious comorbidity with body mass index (BMI) of 39.0 to 39.9 in adult:  The patient was last seen for this 4 months ago. Changes made at last visit include; Patient has lost about 15 pounds since last visit with diet and exercise.  She probably needs about a 30% body weight reduction and then reassess.  ----------------------------------------------------------------------------------------- Prediabetes, Follow-up  Lab Results  Component Value Date   HGBA1C 5.5 03/18/2022   HGBA1C 6.1 (H) 11/14/2021   HGBA1C 6.3 (H) 02/27/2021   GLUCOSE 148 (H) 11/14/2021   GLUCOSE 107 (H) 02/27/2021   GLUCOSE 151 (H) 09/12/2019      Last seen for for this 4 months ago.  Management since that visit includes Consider Ozempic for her prediabetic state..  Pertinent Labs:    Component Value Date/Time   CHOL 200 (H) 11/14/2021 0937   TRIG 59 11/14/2021 0937   CHOLHDL 3.7 11/14/2021 0937   CREATININE 0.69 11/14/2021 0937    Wt Readings from Last 3 Encounters:  03/18/22 221 lb (100.2 kg)  01/14/22 219 lb 9.6 oz (99.6 kg)  01/09/22 221 lb (100.2 kg)    -----------------------------------------------------------------------------------------   Medications: Outpatient Medications Prior to Visit  Medication Sig   fluticasone (FLONASE) 50 MCG/ACT  nasal spray Place 2 sprays into both nostrils daily.   Milk Thistle 1000 MG CAPS    omeprazole (PRILOSEC) 20 MG capsule TAKE 1 CAPSULE DAILY   polyethylene glycol powder (GLYCOLAX/MIRALAX) 17 GM/SCOOP powder Take 17 g by mouth daily. Drink 17g (1 scoop) dissolved in water per day.   Red Yeast Rice Extract (RED YEAST RICE PO) Take by mouth.   sertraline (ZOLOFT) 100 MG tablet TAKE 1 TABLET BY MOUTH EVERY DAY (Patient taking differently: Take 100 mg by mouth daily.)   [DISCONTINUED] Dulaglutide (TRULICITY) 0.10 XN/2.3FT SOPN Inject 0.75 mg into the skin once a week.   [DISCONTINUED] acetaminophen (TYLENOL) 500 MG tablet Take 1 tablet (500 mg total) by mouth every 6 (six) hours as needed (pain).   [DISCONTINUED] Ascorbic Acid (VITAMIN C) 100 MG tablet Take 100 mg by mouth daily. Hasn't taken since 6/12 as of 01/02/22   [DISCONTINUED] ibuprofen (ADVIL) 600 MG tablet Take 1 tablet (600 mg total) by mouth every 6 (six) hours as needed.   [DISCONTINUED] naproxen (NAPROSYN) 500 MG tablet Take 1 tablet (500 mg total) by mouth 2 (two) times daily with a meal. (Patient taking differently: Take 500 mg by mouth as needed.)   [DISCONTINUED] Zinc Sulfate (ZINC 15 PO) Take by mouth. Hasn't taken since 12/16/21 per pt on 01/02/22.   No facility-administered medications prior to visit.    Review of Systems  Constitutional:  Negative for appetite change, chills, fatigue and fever.  Respiratory:  Negative for chest tightness and shortness of breath.  Cardiovascular:  Negative for chest pain and palpitations.  Gastrointestinal:  Negative for abdominal pain, nausea and vomiting.  Neurological:  Negative for dizziness and weakness.    Last hemoglobin A1c Lab Results  Component Value Date   HGBA1C 5.5 03/18/2022       Objective    BP 109/73 (BP Location: Right Arm, Patient Position: Sitting, Cuff Size: Large)   Pulse 87   Resp 16   Ht '5\' 3"'$  (1.6 m)   Wt 221 lb (100.2 kg)   LMP 04/06/2008   SpO2 98%    BMI 39.15 kg/m  BP Readings from Last 3 Encounters:  03/18/22 109/73  02/25/22 118/73  01/15/22 (!) 105/59   Wt Readings from Last 3 Encounters:  03/18/22 221 lb (100.2 kg)  01/14/22 219 lb 9.6 oz (99.6 kg)  01/09/22 221 lb (100.2 kg)      Physical Exam Vitals reviewed.  Constitutional:      Appearance: She is well-developed.  HENT:     Head: Normocephalic and atraumatic.     Right Ear: External ear normal.     Left Ear: External ear normal.     Nose: Nose normal.  Eyes:     General: No scleral icterus.    Conjunctiva/sclera: Conjunctivae normal.  Neck:     Thyroid: No thyromegaly.  Cardiovascular:     Rate and Rhythm: Normal rate and regular rhythm.     Heart sounds: Normal heart sounds.     Comments: Small varicose veins of the leg that are minimally tender but not engorged Pulmonary:     Effort: Pulmonary effort is normal.     Breath sounds: Normal breath sounds.  Abdominal:     Palpations: Abdomen is soft.  Skin:    General: Skin is warm and dry.  Neurological:     Mental Status: She is alert and oriented to person, place, and time.  Psychiatric:        Behavior: Behavior normal.        Thought Content: Thought content normal.        Judgment: Judgment normal.       Results for orders placed or performed in visit on 03/18/22  POCT glycosylated hemoglobin (Hb A1C)  Result Value Ref Range   Hemoglobin A1C 5.5 4.0 - 5.6 %   Est. average glucose Bld gHb Est-mCnc 111     Assessment & Plan     1. Borderline diabetes Increase Trulicity to 1.5 weekly - POCT glycosylated hemoglobin (Hb A1C) - Dulaglutide (TRULICITY) 1.5 DU/2.0UR SOPN; Inject 1.5 mg into the skin once a week.  Dispense: 6 mL; Refill: 3  2. Class 2 obesity due to excess calories without serious comorbidity with body mass index (BMI) of 39.0 to 39.9 in adult  - Dulaglutide (TRULICITY) 1.5 [place]/7.0WC SOPN; Inject 1.5 mg into the skin once a week.  Dispense: 6 mL; Refill: 3  3. Varicose veins of  leg with pain, left   4. Type 2 diabetes mellitus without complication, without long-term current use of insulin (HCC) Prediabetic with A1c of 5.5 today with weight loss and Trulicity  5. Primary osteoarthritis of right knee TKR later in the year  6. Uterovaginal prolapse, incomplete Recent surgical repair  7. Hypercholesteremia    No follow-ups on file.      I, Wilhemena Durie, MD, have reviewed all documentation for this visit. The documentation on 03/22/22 for the exam, diagnosis, procedures, and orders are all accurate and complete.    Cailyn Houdek Rosanna Randy  [place], [place] [phone] (phone) [phone] (fax)  [place]

## 2022-03-18 NOTE — Patient Instructions (Signed)
Christella Scheuermann PT with [facility]

## 2022-03-22 ENCOUNTER — Other Ambulatory Visit: Payer: Self-pay | Admitting: Family Medicine

## 2022-03-22 DIAGNOSIS — K219 Gastro-esophageal reflux disease without esophagitis: Secondary | ICD-10-CM

## 2022-03-26 DIAGNOSIS — M1711 Unilateral primary osteoarthritis, right knee: Secondary | ICD-10-CM | POA: Diagnosis not present

## 2022-03-27 DIAGNOSIS — M1711 Unilateral primary osteoarthritis, right knee: Secondary | ICD-10-CM | POA: Diagnosis not present

## 2022-04-22 DIAGNOSIS — H43813 Vitreous degeneration, bilateral: Secondary | ICD-10-CM | POA: Diagnosis not present

## 2022-04-28 ENCOUNTER — Encounter: Payer: Self-pay | Admitting: *Deleted

## 2022-04-30 DIAGNOSIS — R7309 Other abnormal glucose: Secondary | ICD-10-CM | POA: Diagnosis not present

## 2022-04-30 DIAGNOSIS — Z01812 Encounter for preprocedural laboratory examination: Secondary | ICD-10-CM | POA: Diagnosis not present

## 2022-05-03 DIAGNOSIS — M1711 Unilateral primary osteoarthritis, right knee: Secondary | ICD-10-CM | POA: Diagnosis not present

## 2022-05-05 ENCOUNTER — Encounter (INDEPENDENT_AMBULATORY_CARE_PROVIDER_SITE_OTHER): Payer: Self-pay

## 2022-05-08 DIAGNOSIS — Z7982 Long term (current) use of aspirin: Secondary | ICD-10-CM | POA: Diagnosis not present

## 2022-05-08 DIAGNOSIS — K219 Gastro-esophageal reflux disease without esophagitis: Secondary | ICD-10-CM | POA: Diagnosis not present

## 2022-05-08 DIAGNOSIS — E785 Hyperlipidemia, unspecified: Secondary | ICD-10-CM | POA: Diagnosis not present

## 2022-05-08 DIAGNOSIS — M1711 Unilateral primary osteoarthritis, right knee: Secondary | ICD-10-CM | POA: Diagnosis not present

## 2022-05-08 DIAGNOSIS — G4733 Obstructive sleep apnea (adult) (pediatric): Secondary | ICD-10-CM | POA: Diagnosis not present

## 2022-05-08 DIAGNOSIS — Z7985 Long-term (current) use of injectable non-insulin antidiabetic drugs: Secondary | ICD-10-CM | POA: Diagnosis not present

## 2022-05-08 DIAGNOSIS — G8918 Other acute postprocedural pain: Secondary | ICD-10-CM | POA: Diagnosis not present

## 2022-05-08 DIAGNOSIS — I8393 Asymptomatic varicose veins of bilateral lower extremities: Secondary | ICD-10-CM | POA: Diagnosis not present

## 2022-05-08 DIAGNOSIS — J439 Emphysema, unspecified: Secondary | ICD-10-CM | POA: Diagnosis not present

## 2022-05-08 DIAGNOSIS — R7303 Prediabetes: Secondary | ICD-10-CM | POA: Diagnosis not present

## 2022-05-09 DIAGNOSIS — M1711 Unilateral primary osteoarthritis, right knee: Secondary | ICD-10-CM | POA: Diagnosis not present

## 2022-05-09 DIAGNOSIS — Z7985 Long-term (current) use of injectable non-insulin antidiabetic drugs: Secondary | ICD-10-CM | POA: Diagnosis not present

## 2022-05-09 DIAGNOSIS — G4733 Obstructive sleep apnea (adult) (pediatric): Secondary | ICD-10-CM | POA: Diagnosis not present

## 2022-05-09 DIAGNOSIS — E785 Hyperlipidemia, unspecified: Secondary | ICD-10-CM | POA: Diagnosis not present

## 2022-05-09 DIAGNOSIS — Z7982 Long term (current) use of aspirin: Secondary | ICD-10-CM | POA: Diagnosis not present

## 2022-05-09 DIAGNOSIS — K219 Gastro-esophageal reflux disease without esophagitis: Secondary | ICD-10-CM | POA: Diagnosis not present

## 2022-05-09 DIAGNOSIS — I8393 Asymptomatic varicose veins of bilateral lower extremities: Secondary | ICD-10-CM | POA: Diagnosis not present

## 2022-05-09 DIAGNOSIS — R7303 Prediabetes: Secondary | ICD-10-CM | POA: Diagnosis not present

## 2022-05-09 DIAGNOSIS — J439 Emphysema, unspecified: Secondary | ICD-10-CM | POA: Diagnosis not present

## 2022-05-19 DIAGNOSIS — M25661 Stiffness of right knee, not elsewhere classified: Secondary | ICD-10-CM | POA: Diagnosis not present

## 2022-05-19 DIAGNOSIS — M25561 Pain in right knee: Secondary | ICD-10-CM | POA: Diagnosis not present

## 2022-05-21 DIAGNOSIS — M25561 Pain in right knee: Secondary | ICD-10-CM | POA: Diagnosis not present

## 2022-05-21 DIAGNOSIS — M25661 Stiffness of right knee, not elsewhere classified: Secondary | ICD-10-CM | POA: Diagnosis not present

## 2022-05-26 DIAGNOSIS — M25661 Stiffness of right knee, not elsewhere classified: Secondary | ICD-10-CM | POA: Diagnosis not present

## 2022-05-26 DIAGNOSIS — M25561 Pain in right knee: Secondary | ICD-10-CM | POA: Diagnosis not present

## 2022-05-28 DIAGNOSIS — M25561 Pain in right knee: Secondary | ICD-10-CM | POA: Diagnosis not present

## 2022-05-28 DIAGNOSIS — M25661 Stiffness of right knee, not elsewhere classified: Secondary | ICD-10-CM | POA: Diagnosis not present

## 2022-06-03 DIAGNOSIS — M25661 Stiffness of right knee, not elsewhere classified: Secondary | ICD-10-CM | POA: Diagnosis not present

## 2022-06-03 DIAGNOSIS — M25561 Pain in right knee: Secondary | ICD-10-CM | POA: Diagnosis not present

## 2022-06-04 ENCOUNTER — Other Ambulatory Visit: Payer: Self-pay

## 2022-06-04 ENCOUNTER — Other Ambulatory Visit: Payer: Self-pay | Admitting: Obstetrics and Gynecology

## 2022-06-04 DIAGNOSIS — Z1231 Encounter for screening mammogram for malignant neoplasm of breast: Secondary | ICD-10-CM

## 2022-06-04 DIAGNOSIS — K219 Gastro-esophageal reflux disease without esophagitis: Secondary | ICD-10-CM

## 2022-06-04 DIAGNOSIS — F419 Anxiety disorder, unspecified: Secondary | ICD-10-CM

## 2022-06-04 MED ORDER — OMEPRAZOLE 20 MG PO CPDR: 20.0000 mg | DELAYED_RELEASE_CAPSULE | Freq: Every day | ORAL | 0 refills | Status: AC

## 2022-06-04 MED ORDER — SERTRALINE HCL 100 MG PO TABS: 100.0000 mg | ORAL_TABLET | Freq: Every day | ORAL | 0 refills | Status: AC

## 2022-06-09 DIAGNOSIS — M25561 Pain in right knee: Secondary | ICD-10-CM | POA: Diagnosis not present

## 2022-06-09 DIAGNOSIS — M25661 Stiffness of right knee, not elsewhere classified: Secondary | ICD-10-CM | POA: Diagnosis not present

## 2022-06-10 DIAGNOSIS — H2512 Age-related nuclear cataract, left eye: Secondary | ICD-10-CM | POA: Diagnosis not present

## 2022-06-11 DIAGNOSIS — M25661 Stiffness of right knee, not elsewhere classified: Secondary | ICD-10-CM | POA: Diagnosis not present

## 2022-06-11 DIAGNOSIS — M25561 Pain in right knee: Secondary | ICD-10-CM | POA: Diagnosis not present

## 2022-06-16 DIAGNOSIS — M25561 Pain in right knee: Secondary | ICD-10-CM | POA: Diagnosis not present

## 2022-06-16 DIAGNOSIS — Z96651 Presence of right artificial knee joint: Secondary | ICD-10-CM | POA: Diagnosis not present

## 2022-06-16 DIAGNOSIS — M25661 Stiffness of right knee, not elsewhere classified: Secondary | ICD-10-CM | POA: Diagnosis not present

## 2022-06-23 ENCOUNTER — Encounter: Payer: Self-pay | Admitting: Ophthalmology

## 2022-06-23 DIAGNOSIS — M25561 Pain in right knee: Secondary | ICD-10-CM | POA: Diagnosis not present

## 2022-06-23 DIAGNOSIS — M25661 Stiffness of right knee, not elsewhere classified: Secondary | ICD-10-CM | POA: Diagnosis not present

## 2022-06-24 ENCOUNTER — Ambulatory Visit
Admission: RE | Admit: 2022-06-24 | Discharge: 2022-06-24 | Disposition: A | Discharge status: Home / Self Care | Payer: BC Managed Care – PPO | Source: Ambulatory Visit | Attending: Obstetrics and Gynecology | Admitting: Obstetrics and Gynecology

## 2022-06-24 DIAGNOSIS — G4733 Obstructive sleep apnea (adult) (pediatric): Secondary | ICD-10-CM | POA: Diagnosis not present

## 2022-06-24 DIAGNOSIS — G4710 Hypersomnia, unspecified: Secondary | ICD-10-CM | POA: Diagnosis not present

## 2022-06-24 DIAGNOSIS — Z1231 Encounter for screening mammogram for malignant neoplasm of breast: Secondary | ICD-10-CM | POA: Insufficient documentation

## 2022-06-26 DIAGNOSIS — M25661 Stiffness of right knee, not elsewhere classified: Secondary | ICD-10-CM | POA: Diagnosis not present

## 2022-06-26 DIAGNOSIS — M25561 Pain in right knee: Secondary | ICD-10-CM | POA: Diagnosis not present

## 2022-06-26 NOTE — Discharge Instructions (Signed)

## 2022-07-01 DIAGNOSIS — M25661 Stiffness of right knee, not elsewhere classified: Secondary | ICD-10-CM | POA: Diagnosis not present

## 2022-07-01 DIAGNOSIS — M25561 Pain in right knee: Secondary | ICD-10-CM | POA: Diagnosis not present

## 2022-07-02 ENCOUNTER — Ambulatory Visit: Payer: BC Managed Care – PPO | Admitting: Anesthesiology

## 2022-07-02 ENCOUNTER — Encounter: Payer: Self-pay | Admitting: Ophthalmology

## 2022-07-02 ENCOUNTER — Encounter
Admission: RE | Disposition: A | Discharge status: Home / Self Care | Payer: Self-pay | Source: Home / Self Care | Attending: Ophthalmology

## 2022-07-02 ENCOUNTER — Ambulatory Visit
Admission: RE | Admit: 2022-07-02 | Discharge: 2022-07-02 | Disposition: A | Discharge status: Home / Self Care | Payer: BC Managed Care – PPO | Attending: Ophthalmology | Admitting: Ophthalmology

## 2022-07-02 ENCOUNTER — Other Ambulatory Visit: Payer: Self-pay

## 2022-07-02 DIAGNOSIS — Z6838 Body mass index (BMI) 38.0-38.9, adult: Secondary | ICD-10-CM | POA: Insufficient documentation

## 2022-07-02 DIAGNOSIS — K219 Gastro-esophageal reflux disease without esophagitis: Secondary | ICD-10-CM | POA: Insufficient documentation

## 2022-07-02 DIAGNOSIS — H2512 Age-related nuclear cataract, left eye: Secondary | ICD-10-CM | POA: Diagnosis not present

## 2022-07-02 DIAGNOSIS — E669 Obesity, unspecified: Secondary | ICD-10-CM | POA: Insufficient documentation

## 2022-07-02 DIAGNOSIS — E119 Type 2 diabetes mellitus without complications: Secondary | ICD-10-CM | POA: Diagnosis not present

## 2022-07-02 DIAGNOSIS — R7303 Prediabetes: Secondary | ICD-10-CM | POA: Diagnosis not present

## 2022-07-02 DIAGNOSIS — G4730 Sleep apnea, unspecified: Secondary | ICD-10-CM | POA: Diagnosis not present

## 2022-07-02 DIAGNOSIS — J449 Chronic obstructive pulmonary disease, unspecified: Secondary | ICD-10-CM | POA: Diagnosis not present

## 2022-07-02 DIAGNOSIS — Z87891 Personal history of nicotine dependence: Secondary | ICD-10-CM | POA: Diagnosis not present

## 2022-07-02 DIAGNOSIS — M1990 Unspecified osteoarthritis, unspecified site: Secondary | ICD-10-CM | POA: Diagnosis not present

## 2022-07-02 DIAGNOSIS — F419 Anxiety disorder, unspecified: Secondary | ICD-10-CM | POA: Insufficient documentation

## 2022-07-02 HISTORY — PX: CATARACT EXTRACTION W/PHACO: SHX586

## 2022-07-02 SURGERY — PHACOEMULSIFICATION, CATARACT, WITH IOL INSERTION
Anesthesia: Monitor Anesthesia Care | Site: Eye | Laterality: Left

## 2022-07-02 MED ORDER — TETRACAINE HCL 0.5 % OP SOLN
1.0000 [drp] | OPHTHALMIC | Status: DC | PRN
  Administered 2022-07-02 (×3): 1 [drp] via OPHTHALMIC

## 2022-07-02 MED ORDER — ONDANSETRON HCL 4 MG/2ML IJ SOLN: 4.0000 mg | Freq: Once | INTRAMUSCULAR | Status: DC | PRN

## 2022-07-02 MED ORDER — LACTATED RINGERS IV SOLN: INTRAVENOUS | Status: DC

## 2022-07-02 MED ORDER — SIGHTPATH DOSE#1 NA HYALUR & NA CHOND-NA HYALUR IO KIT
PACK | INTRAOCULAR | Status: DC | PRN
  Administered 2022-07-02: 1 via OPHTHALMIC

## 2022-07-02 MED ORDER — ACETAMINOPHEN 160 MG/5ML PO SOLN: 325.0000 mg | ORAL | Status: DC | PRN

## 2022-07-02 MED ORDER — SIGHTPATH DOSE#1 BSS IO SOLN
INTRAOCULAR | Status: DC | PRN
  Administered 2022-07-02: 15 mL via INTRAOCULAR

## 2022-07-02 MED ORDER — SIGHTPATH DOSE#1 BSS IO SOLN
INTRAOCULAR | Status: DC | PRN
  Administered 2022-07-02: 2 mL

## 2022-07-02 MED ORDER — ARMC OPHTHALMIC DILATING DROPS
1.0000 | OPHTHALMIC | Status: DC | PRN
  Administered 2022-07-02 (×3): 1 via OPHTHALMIC

## 2022-07-02 MED ORDER — FENTANYL CITRATE (PF) 100 MCG/2ML IJ SOLN
INTRAMUSCULAR | Status: DC | PRN
  Administered 2022-07-02: 50 ug via INTRAVENOUS

## 2022-07-02 MED ORDER — SIGHTPATH DOSE#1 BSS IO SOLN
INTRAOCULAR | Status: DC | PRN
  Administered 2022-07-02: 54 mL via OPHTHALMIC

## 2022-07-02 MED ORDER — BRIMONIDINE TARTRATE-TIMOLOL 0.2-0.5 % OP SOLN
OPHTHALMIC | Status: DC | PRN
  Administered 2022-07-02: 1 [drp] via OPHTHALMIC

## 2022-07-02 MED ORDER — ACETAMINOPHEN 325 MG PO TABS: 650.0000 mg | ORAL_TABLET | Freq: Once | ORAL | Status: DC | PRN

## 2022-07-02 MED ORDER — MIDAZOLAM HCL 2 MG/2ML IJ SOLN
INTRAMUSCULAR | Status: DC | PRN
  Administered 2022-07-02 (×2): 1 mg via INTRAVENOUS

## 2022-07-02 MED ORDER — CEFUROXIME OPHTHALMIC INJECTION 1 MG/0.1 ML
INJECTION | OPHTHALMIC | Status: DC | PRN
  Administered 2022-07-02: .1 mL via INTRACAMERAL

## 2022-07-02 SURGICAL SUPPLY — 20 items
CANNULA ANT/CHMB 27G (MISCELLANEOUS) IMPLANT
CANNULA ANT/CHMB 27GA (MISCELLANEOUS) IMPLANT
CATARACT SUITE SIGHTPATH (MISCELLANEOUS) ×1 IMPLANT
FEE CATARACT SUITE SIGHTPATH (MISCELLANEOUS) ×1 IMPLANT
GLOVE SRG 8 PF TXTR STRL LF DI (GLOVE) ×1 IMPLANT
GLOVE SURG ENC TEXT LTX SZ7.5 (GLOVE) ×1 IMPLANT
GLOVE SURG GAMMEX PI TX LF 7.5 (GLOVE) IMPLANT
GLOVE SURG UNDER POLY LF SZ8 (GLOVE) ×1
LENS IOL TECNIS EYHANCE 19.0 (Intraocular Lens) IMPLANT
NDL FILTER BLUNT 18X1 1/2 (NEEDLE) ×1 IMPLANT
NDL RETROBULBAR .5 NSTRL (NEEDLE) IMPLANT
NEEDLE FILTER BLUNT 18X1 1/2 (NEEDLE) ×1 IMPLANT
PACK VIT ANT 23G (MISCELLANEOUS) IMPLANT
RING MALYGIN 7.0 (MISCELLANEOUS) IMPLANT
SUT ETHILON 10-0 CS-B-6CS-B-6 (SUTURE)
SUT VICRYL  9 0 (SUTURE)
SUT VICRYL 9 0 (SUTURE) IMPLANT
SUTURE EHLN 10-0 CS-B-6CS-B-6 (SUTURE) IMPLANT
SYR 3ML LL SCALE MARK (SYRINGE) ×1 IMPLANT
WATER STERILE IRR 250ML POUR (IV SOLUTION) ×1 IMPLANT

## 2022-07-02 NOTE — Op Note (Signed)
OPERATIVE NOTE  Evelyn Hayes [id number] 07/02/2022   PREOPERATIVE DIAGNOSIS:  Nuclear sclerotic cataract left eye. H25.12   POSTOPERATIVE DIAGNOSIS:    Nuclear sclerotic cataract left eye.     PROCEDURE:  Phacoemusification with posterior chamber intraocular lens placement of the left eye  Ultrasound time: Procedure(s): CATARACT EXTRACTION PHACO AND INTRAOCULAR LENS PLACEMENT (IOC) LEFT 6.47 00:49.8 (Left)  LENS:   Implant Name Type Inv. Item Serial No. Manufacturer Lot No. LRB No. Used Action  LENS IOL TECNIS EYHANCE 19.0 - [id number] Intraocular Lens LENS IOL TECNIS EYHANCE 19.0 [id number] [employer]  Left 1 Implanted      SURGEON:  Wyonia Hough, MD   ANESTHESIA:  Topical with tetracaine drops and 2% Xylocaine jelly, augmented with 1% preservative-free intracameral lidocaine.    COMPLICATIONS:  None.   DESCRIPTION OF PROCEDURE:  The patient was identified in the holding room and transported to the operating room and placed in the supine position under the operating microscope.  The left eye was identified as the operative eye and it was prepped and draped in the usual sterile ophthalmic fashion.   A 1 millimeter clear-corneal paracentesis was made at the 1:30 position.  0.5 ml of preservative-free 1% lidocaine was injected into the anterior chamber.  The anterior chamber was filled with Viscoat viscoelastic.  A 2.4 millimeter keratome was used to make a near-clear corneal incision at the 10:30 position.  .  A curvilinear capsulorrhexis was made with a cystotome and capsulorrhexis forceps.  Balanced salt solution was used to hydrodissect and hydrodelineate the nucleus.   Phacoemulsification was then used in stop and chop fashion to remove the lens nucleus and epinucleus.  The remaining cortex was then removed using the irrigation and aspiration handpiece. Provisc was then placed into the capsular bag to distend it for lens placement.  A lens was then injected into the  capsular bag.  The remaining viscoelastic was aspirated.   Wounds were hydrated with balanced salt solution.  The anterior chamber was inflated to a physiologic pressure with balanced salt solution.  No wound leaks were noted. Cefuroxime 0.1 ml of a '10mg'$ /ml solution was injected into the anterior chamber for a dose of 1 mg of intracameral antibiotic at the completion of the case.   Timolol and Brimonidine drops were applied to the eye.  The patient was taken to the recovery room in stable condition without complications of anesthesia or surgery.  Nelani Schmelzle 07/02/2022, 8:08 AM

## 2022-07-02 NOTE — Anesthesia Preprocedure Evaluation (Addendum)
Anesthesia Evaluation  Patient identified by MRN, date of birth, ID band Patient awake    Reviewed: Allergy & Precautions, NPO status , Patient's Chart, lab work & pertinent test results  History of Anesthesia Complications Negative for: history of anesthetic complications  Airway Mallampati: I   Neck ROM: Full    Dental no notable dental hx.    Pulmonary sleep apnea and Continuous Positive Airway Pressure Ventilation , COPD, former smoker (quit age 70s)   Pulmonary exam normal breath sounds clear to auscultation       Cardiovascular Exercise Tolerance: Good negative cardio ROS Normal cardiovascular exam Rhythm:Regular Rate:Normal     Neuro/Psych  PSYCHIATRIC DISORDERS Anxiety     negative neurological ROS     GI/Hepatic ,GERD  ,,  Endo/Other  Obesity; prediabetes  Renal/GU Renal disease (nephrolithiasis)     Musculoskeletal  (+) Arthritis ,    Abdominal   Peds  Hematology negative hematology ROS (+)   Anesthesia Other Findings   Reproductive/Obstetrics                             Anesthesia Physical Anesthesia Plan  ASA: 2  Anesthesia Plan: MAC   Post-op Pain Management:    Induction: Intravenous  PONV Risk Score and Plan: 2 and Treatment may vary due to age or medical condition, Midazolam and TIVA  Airway Management Planned: Natural Airway and Nasal Cannula  Additional Equipment:   Intra-op Plan:   Post-operative Plan:   Informed Consent: I have reviewed the patients History and Physical, chart, labs and discussed the procedure including the risks, benefits and alternatives for the proposed anesthesia with the patient or authorized representative who has indicated his/her understanding and acceptance.     Dental advisory given  Plan Discussed with: CRNA  Anesthesia Plan Comments: (LMA/GETA backup discussed.  Patient consented for risks of anesthesia including but  not limited to:  - adverse reactions to medications - damage to eyes, teeth, lips or other oral mucosa - nerve damage due to positioning  - sore throat or hoarseness - damage to heart, brain, nerves, lungs, other parts of body or loss of life  Informed patient about role of CRNA in peri- and intra-operative care.  Patient voiced understanding.)       Anesthesia Quick Evaluation

## 2022-07-02 NOTE — H&P (Signed)
[facility]   Primary Care Physician:  Jerrol Banana., MD Ophthalmologist: Dr. Leandrew Koyanagi  Pre-Procedure History & Physical: HPI:  Evelyn Hayes is a 66 y.o. female here for ophthalmic surgery.   Past Medical History:  Diagnosis Date   Anxiety    Arthritis    right knee   Cancer ([place]) 2004   basal cell on right eyelid   Complication of anesthesia 02/01/2019   Patient experienced a laryngospasm during an EGD. Per endoscopy report by Dr. Carol Ada, " Her oxygen saturation briefly dropped into the mid to high 70's, but she maintained her saturation at around 86%. Jaw thrust was employed and there was a point where positive pressure ventilation was going to be administered, but her laryngospasm resolved. Her oxygen saturation normalized."   COPD (chronic obstructive pulmonary disease) ([place]) 08/13/2020   COPD GOLD stage 1, see 08/20/20 OV w/ Dr. Ottie Glazier, pulmonologist in [place]. Patient is doing great w/ lots of walking and water aerobics as of 01/02/22 per pt.   CPAP (continuous positive airway pressure) dependence 05/2011   DOE (dyspnea on exertion) 02/24/2020   03/14/20 treadmill stress test showed ECG w/o evidence of ischemia or arrhythmia, 04/09/2020 echocardiogram LVEF 55 - 60%, 03/13/2020 Holter monitor rare PACs and occasional PVC, see cardiology OV dated 04/23/20 with Dr. Hassell Done in [place]. Also see 08/20/20 OV with pulmonolgy in Epic.   GERD (gastroesophageal reflux disease)    History of kidney stones 09/2019   Knee cartilage, torn, right 2003   Laryngopharyngeal reflux 12/2005   Obesity    Palpitations 2021   see 04/23/20 cardiology OV note from Dr. Hassell Done in [place].   Pneumonia 2019   Pt states that she was told it was possibly COVID that caused her pneumonia.   Pre-diabetes    5/11 A1c 6.1   Right inguinal hernia 2023   Sleep apnea    borderline, patient has used in about 4 years as of 01/02/22 per pt, she has lost some weight but  still snores per pt   Varicose veins of left lower extremity     Past Surgical History:  Procedure Laterality Date   BLADDER SUSPENSION N/A 01/14/2022   Procedure: TRANSVAGINAL TAPE (TVT) PROCEDURE WITH OPERATIVE ULTRASOUND;  Surgeon: Jaquita Folds, MD;  Location: [place];  Service: Gynecology;  Laterality: N/A;   CERVICAL BIOPSY  W/ LOOP ELECTRODE EXCISION  07/07/1994   CIN 3   COLONOSCOPY     x 2, last one 2020?   COLPOSCOPY  07/07/1996   CYSTOSCOPY N/A 01/14/2022   Procedure: CYSTOSCOPY;  Surgeon: Jaquita Folds, MD;  Location: [facility];  Service: Gynecology;  Laterality: N/A;   hypo thermal ablation  06/02/2005   KNEE SURGERY  07/07/2004   TONSILLECTOMY AND ADENOIDECTOMY     as a child   WISDOM TOOTH EXTRACTION     as a teenager   XI ROBOTIC ASSISTED TOTAL HYSTERECTOMY WITH SACROCOLPOPEXY N/A 01/14/2022   Procedure: XI ROBOTIC ASSISTED TOTAL HYSTERECTOMY WITH BILATERAL SALPINGECTOMY AND   SACROCOLPOPEXY;  Surgeon: Jaquita Folds, MD;  Location: [facility];  Service: Gynecology;  Laterality: N/A;    Prior to Admission medications   Medication Sig Start Date End Date Taking? Authorizing Provider  acetaminophen (TYLENOL) 160 MG/5ML liquid Take by mouth 2 (two) times daily.   Yes [provider]  Dulaglutide (TRULICITY) 1.5 YP/9.5KD SOPN Inject 1.5 mg into the skin once a week. 03/18/22  Yes Jerrol Banana., MD  fluticasone [facility]) 50 MCG/ACT nasal spray Place 2 sprays into both nostrils daily. 09/17/15  Yes Jerrol Banana., MD  Milk Thistle 1000 MG CAPS  08/07/21  Yes [provider]  omeprazole (PRILOSEC) 20 MG capsule Take 1 capsule (20 mg total) by mouth daily. 06/04/22  Yes Simmons-Robinson, Makiera, MD  Red Yeast Rice Extract (RED YEAST RICE PO) Take by mouth.   Yes [provider]  sertraline (ZOLOFT) 100 MG tablet Take 1 tablet (100 mg total) by mouth daily. 06/04/22   Yes Simmons-Robinson, Makiera, MD  polyethylene glycol powder (GLYCOLAX/MIRALAX) 17 GM/SCOOP powder Take 17 g by mouth daily. Drink 17g (1 scoop) dissolved in water per day. Patient not taking: Reported on 06/23/2022 12/24/21   Jaquita Folds, MD    Allergies as of 05/08/2022   (No Known Allergies)    Family History  Problem Relation Age of Onset   Hypertension Mother    Heart disease Father    Breast cancer Other    Osteoporosis Maternal Grandmother    Ovarian cancer Cousin 56    Social History   Socioeconomic History   Marital status: Married    Spouse name: Not on file   Number of children: Not on file   Years of education: Not on file   Highest education level: Not on file  Occupational History   Not on file  Tobacco Use   Smoking status: Former    Years: 2.00    Types: Cigarettes   Smokeless tobacco: Never   Tobacco comments:    smoked for about 2 years in high school  Vaping Use   Vaping Use: Never used  Substance and Sexual Activity   Alcohol use: Yes    Alcohol/week: 4.0 standard drinks of alcohol    Types: 3 Glasses of wine, 1 Cans of beer per week   Drug use: No   Sexual activity: Yes    Partners: Male    Birth control/protection: Post-menopausal  Other Topics Concern   Not on file  Social History Narrative   Not on file   Social Determinants of Health   Financial Resource Strain: Not on file  Food Insecurity: Not on file  Transportation Needs: Not on file  Physical Activity: Not on file  Stress: Not on file  Social Connections: Not on file  Intimate Partner Violence: Not on file    Review of Systems: See HPI, otherwise negative ROS  Physical Exam: BP 128/75   Temp 98.7 F (37.1 C) (Temporal)   Resp 18   Ht '5\' 3"'$  (1.6 m)   Wt 98.4 kg   LMP 04/06/2008   SpO2 95%   BMI 38.44 kg/m  General:   Alert,  pleasant and cooperative in NAD Head:  Normocephalic and atraumatic. Lungs:  Clear to auscultation.    Heart:  Regular rate and  rhythm.   Impression/Plan: [name] is here for ophthalmic surgery.  Risks, benefits, limitations, and alternatives regarding ophthalmic surgery have been reviewed with the patient.  Questions have been answered.  All parties agreeable.   Leandrew Koyanagi, MD  07/02/2022, 7:32 AM

## 2022-07-02 NOTE — Transfer of Care (Signed)
Immediate Anesthesia Transfer of Care Note  [name]  Procedure(s) Performed: CATARACT EXTRACTION PHACO AND INTRAOCULAR LENS PLACEMENT (IOC) LEFT 6.47 00:49.8 (Left: Eye)  Patient Location: PACU  Anesthesia Type: MAC  Level of Consciousness: awake, alert  and patient cooperative  Airway and Oxygen Therapy: Patient Spontanous Breathing and Patient connected to supplemental oxygen  Post-op Assessment: Post-op Vital signs reviewed, Patient's Cardiovascular Status Stable, Respiratory Function Stable, Patent Airway and No signs of Nausea or vomiting  Post-op Vital Signs: Reviewed and stable  Complications: No notable events documented.

## 2022-07-02 NOTE — Anesthesia Postprocedure Evaluation (Signed)
Anesthesia Post Note  [name]  Procedure(s) Performed: CATARACT EXTRACTION PHACO AND INTRAOCULAR LENS PLACEMENT (IOC) LEFT 6.47 00:49.8 (Left: Eye)  Patient location during evaluation: PACU Anesthesia Type: MAC Level of consciousness: awake and alert, oriented and patient cooperative Pain management: pain level controlled Vital Signs Assessment: post-procedure vital signs reviewed and stable Respiratory status: spontaneous breathing, nonlabored ventilation and respiratory function stable Cardiovascular status: blood pressure returned to baseline and stable Postop Assessment: adequate PO intake Anesthetic complications: no   No notable events documented.   Last Vitals:  Vitals:   07/02/22 0809 07/02/22 0813  BP: 118/74 125/77  Pulse: 85 84  Resp: 14 12  Temp: 36.4 C 36.4 C  SpO2: 93% 93%    Last Pain:  Vitals:   07/02/22 0813  TempSrc:   PainSc: 0-No pain                 Darrin Nipper

## 2022-07-08 ENCOUNTER — Ambulatory Visit: Payer: BC Managed Care – PPO | Admitting: Obstetrics and Gynecology

## 2022-07-16 DIAGNOSIS — L578 Other skin changes due to chronic exposure to nonionizing radiation: Secondary | ICD-10-CM | POA: Diagnosis not present

## 2022-07-16 DIAGNOSIS — Z872 Personal history of diseases of the skin and subcutaneous tissue: Secondary | ICD-10-CM | POA: Diagnosis not present

## 2022-07-16 DIAGNOSIS — Z85828 Personal history of other malignant neoplasm of skin: Secondary | ICD-10-CM | POA: Diagnosis not present

## 2022-07-16 DIAGNOSIS — L738 Other specified follicular disorders: Secondary | ICD-10-CM | POA: Diagnosis not present

## 2022-07-25 DIAGNOSIS — G4733 Obstructive sleep apnea (adult) (pediatric): Secondary | ICD-10-CM | POA: Diagnosis not present

## 2022-07-25 DIAGNOSIS — G4710 Hypersomnia, unspecified: Secondary | ICD-10-CM | POA: Diagnosis not present

## 2022-07-30 DIAGNOSIS — G4733 Obstructive sleep apnea (adult) (pediatric): Secondary | ICD-10-CM | POA: Diagnosis not present

## 2022-08-15 ENCOUNTER — Other Ambulatory Visit: Payer: Self-pay | Admitting: Family Medicine

## 2022-08-15 DIAGNOSIS — F419 Anxiety disorder, unspecified: Secondary | ICD-10-CM

## 2022-08-15 NOTE — Telephone Encounter (Signed)
Pt needs to schedule appt with Dr. Quentin Cornwall or another provider to [facility].   Requested Prescriptions  Refused Prescriptions Disp Refills   sertraline (ZOLOFT) 100 MG tablet [Pharmacy Med Name: SERTRALINE TAB 100MG] 90 tablet 0    Sig: TAKE 1 TABLET DAILY     Psychiatry:  Antidepressants - SSRI - sertraline Passed - 08/15/2022  5:54 PM      Passed - AST in normal range and within 360 days    AST  Date Value Ref Range Status  11/14/2021 20 0 - 40 IU/L Final         Passed - ALT in normal range and within 360 days    ALT  Date Value Ref Range Status  11/14/2021 31 0 - 32 IU/L Final         Passed - Completed PHQ-2 or PHQ-9 in the last 360 days      Passed - Valid encounter within last 6 months    Recent Outpatient Visits           5 months ago Borderline diabetes   [facility] Eulas Post, MD   8 months ago Class 2 obesity due to excess calories without serious comorbidity with body mass index (BMI) of 39.0 to 39.9 in adult   [facility] Eulas Post, MD   9 months ago Evelyn Eulas Post, MD   1 year ago [facility] Eulas Post, MD   4 years ago Need for zoster vaccination   [facility] [place] Eulas Post, MD

## 2022-08-18 NOTE — Telephone Encounter (Signed)
Patient reports she will be following Dr.Gilbert and will wait until then. Advised if she is not able to get an appointment to give [place] a a call back and we can get her scheduled to be seen. Patient verbalized understanding

## 2022-08-25 DIAGNOSIS — G4733 Obstructive sleep apnea (adult) (pediatric): Secondary | ICD-10-CM | POA: Diagnosis not present

## 2022-08-25 DIAGNOSIS — G4710 Hypersomnia, unspecified: Secondary | ICD-10-CM | POA: Diagnosis not present

## 2022-08-27 DIAGNOSIS — E669 Obesity, unspecified: Secondary | ICD-10-CM | POA: Diagnosis not present

## 2022-08-27 DIAGNOSIS — Z6839 Body mass index (BMI) 39.0-39.9, adult: Secondary | ICD-10-CM | POA: Diagnosis not present

## 2022-08-27 DIAGNOSIS — G4733 Obstructive sleep apnea (adult) (pediatric): Secondary | ICD-10-CM | POA: Diagnosis not present

## 2023-06-16 ENCOUNTER — Other Ambulatory Visit: Payer: Self-pay | Admitting: Family Medicine

## 2023-06-16 DIAGNOSIS — Z1231 Encounter for screening mammogram for malignant neoplasm of breast: Secondary | ICD-10-CM

## 2023-06-26 ENCOUNTER — Ambulatory Visit
Admission: RE | Admit: 2023-06-26 | Discharge: 2023-06-26 | Disposition: A | Discharge status: Home / Self Care | Payer: Medicare Other | Source: Ambulatory Visit | Attending: Family Medicine | Admitting: Family Medicine

## 2023-06-26 DIAGNOSIS — Z1231 Encounter for screening mammogram for malignant neoplasm of breast: Secondary | ICD-10-CM | POA: Diagnosis present

## 2024-05-30 ENCOUNTER — Other Ambulatory Visit: Payer: Self-pay | Admitting: Family Medicine

## 2024-05-30 DIAGNOSIS — Z1231 Encounter for screening mammogram for malignant neoplasm of breast: Secondary | ICD-10-CM

## 2024-07-01 ENCOUNTER — Encounter

## 2024-07-01 ENCOUNTER — Ambulatory Visit
Admission: RE | Admit: 2024-07-01 | Discharge: 2024-07-01 | Disposition: A | Discharge status: Home / Self Care | Source: Ambulatory Visit | Attending: Family Medicine | Admitting: Family Medicine

## 2024-07-01 DIAGNOSIS — Z1231 Encounter for screening mammogram for malignant neoplasm of breast: Secondary | ICD-10-CM | POA: Insufficient documentation
